# Patient Record
Sex: Female | Born: 1980 | Race: White | Hispanic: No | Marital: Married | Smoking: Never smoker
Health system: Southern US, Community
[De-identification: ages and names within clinical notes are randomized; demographics above are authoritative.]

## PROBLEM LIST (undated history)

## (undated) DIAGNOSIS — Z803 Family history of malignant neoplasm of breast: Secondary | ICD-10-CM

## (undated) DIAGNOSIS — Z9189 Other specified personal risk factors, not elsewhere classified: Secondary | ICD-10-CM

## (undated) DIAGNOSIS — Z9289 Personal history of other medical treatment: Secondary | ICD-10-CM

## (undated) DIAGNOSIS — Z1371 Encounter for nonprocreative screening for genetic disease carrier status: Secondary | ICD-10-CM

## (undated) HISTORY — DX: Personal history of other medical treatment: Z92.89

## (undated) HISTORY — DX: Family history of malignant neoplasm of breast: Z80.3

## (undated) HISTORY — DX: Encounter for nonprocreative screening for genetic disease carrier status: Z13.71

## (undated) HISTORY — DX: Other specified personal risk factors, not elsewhere classified: Z91.89

---

## 2004-04-26 HISTORY — PX: BUNIONECTOMY: SHX129

## 2007-10-12 ENCOUNTER — Encounter: Payer: Self-pay | Admitting: Maternal & Fetal Medicine

## 2007-12-29 ENCOUNTER — Ambulatory Visit: Payer: Self-pay | Admitting: Obstetrics & Gynecology

## 2008-01-02 ENCOUNTER — Inpatient Hospital Stay: Payer: Self-pay | Admitting: Obstetrics and Gynecology

## 2010-07-28 ENCOUNTER — Ambulatory Visit: Payer: Self-pay | Admitting: Obstetrics and Gynecology

## 2011-04-15 ENCOUNTER — Ambulatory Visit: Payer: Self-pay | Admitting: Obstetrics and Gynecology

## 2012-05-07 ENCOUNTER — Emergency Department: Payer: Self-pay | Admitting: Emergency Medicine

## 2012-05-07 ENCOUNTER — Ambulatory Visit: Payer: Self-pay | Admitting: Family Medicine

## 2012-05-07 LAB — URINALYSIS, COMPLETE
Bacteria: NONE SEEN
Ketone: NEGATIVE
Leukocyte Esterase: NEGATIVE
Nitrite: NEGATIVE
Ph: 7 (ref 4.5–8.0)

## 2012-05-07 LAB — COMPREHENSIVE METABOLIC PANEL
Alkaline Phosphatase: 70 U/L (ref 50–136)
Bilirubin,Total: 0.2 mg/dL (ref 0.2–1.0)
Chloride: 104 mmol/L (ref 98–107)
EGFR (African American): >60
Osmolality: 277 (ref 275–301)
Potassium: 3.7 mmol/L (ref 3.5–5.1)
SGPT (ALT): 26 U/L (ref 12–78)
Sodium: 138 mmol/L (ref 136–145)

## 2012-05-07 LAB — CBC
HCT: 40.7 % (ref 35.0–47.0)
HGB: 14.3 g/dL (ref 12.0–16.0)
MCH: 31.5 pg (ref 26.0–34.0)
RBC: 4.53 10*6/uL (ref 3.80–5.20)
WBC: 7.5 10*3/uL (ref 3.6–11.0)

## 2012-11-07 ENCOUNTER — Encounter: Payer: Self-pay | Admitting: General Surgery

## 2012-11-29 ENCOUNTER — Ambulatory Visit: Payer: Self-pay | Admitting: General Surgery

## 2013-09-03 ENCOUNTER — Emergency Department: Payer: Self-pay | Admitting: Emergency Medicine

## 2013-09-03 LAB — BASIC METABOLIC PANEL
ANION GAP: 8 (ref 7–16)
BUN: 15 mg/dL (ref 7–18)
CO2: 24 mmol/L (ref 21–32)
Calcium, Total: 8 mg/dL — ABNORMAL LOW (ref 8.5–10.1)
Chloride: 109 mmol/L — ABNORMAL HIGH (ref 98–107)
Creatinine: 0.8 mg/dL (ref 0.60–1.30)
EGFR (African American): >60
Glucose: 80 mg/dL (ref 65–99)
Osmolality: 281 (ref 275–301)
Potassium: 3.5 mmol/L (ref 3.5–5.1)
SODIUM: 141 mmol/L (ref 136–145)

## 2013-09-03 LAB — CBC
HCT: 37.6 % (ref 35.0–47.0)
HGB: 12.7 g/dL (ref 12.0–16.0)
MCH: 30.3 pg (ref 26.0–34.0)
MCHC: 33.8 g/dL (ref 32.0–36.0)
MCV: 90 fL (ref 80–100)
PLATELETS: 250 10*3/uL (ref 150–440)
RBC: 4.19 10*6/uL (ref 3.80–5.20)
RDW: 13.3 % (ref 11.5–14.5)
WBC: 6.9 10*3/uL (ref 3.6–11.0)

## 2013-09-03 LAB — TROPONIN I: Troponin-I: <0.02 ng/mL

## 2013-11-21 DIAGNOSIS — Z1371 Encounter for nonprocreative screening for genetic disease carrier status: Secondary | ICD-10-CM

## 2013-11-21 HISTORY — DX: Encounter for nonprocreative screening for genetic disease carrier status: Z13.71

## 2014-11-29 ENCOUNTER — Other Ambulatory Visit: Payer: Self-pay | Admitting: Obstetrics and Gynecology

## 2014-11-29 DIAGNOSIS — Z1231 Encounter for screening mammogram for malignant neoplasm of breast: Secondary | ICD-10-CM

## 2014-11-29 DIAGNOSIS — Z9289 Personal history of other medical treatment: Secondary | ICD-10-CM

## 2014-11-29 HISTORY — DX: Personal history of other medical treatment: Z92.89

## 2014-12-09 ENCOUNTER — Other Ambulatory Visit: Payer: Self-pay | Admitting: Obstetrics and Gynecology

## 2014-12-09 ENCOUNTER — Ambulatory Visit
Admission: RE | Admit: 2014-12-09 | Discharge: 2014-12-09 | Disposition: A | Discharge status: Home / Self Care | Payer: BC Managed Care – PPO | Source: Ambulatory Visit | Attending: Obstetrics and Gynecology | Admitting: Obstetrics and Gynecology

## 2014-12-09 DIAGNOSIS — Z1231 Encounter for screening mammogram for malignant neoplasm of breast: Secondary | ICD-10-CM | POA: Diagnosis not present

## 2014-12-09 DIAGNOSIS — R922 Inconclusive mammogram: Secondary | ICD-10-CM | POA: Insufficient documentation

## 2014-12-20 ENCOUNTER — Other Ambulatory Visit: Payer: Self-pay | Admitting: Obstetrics and Gynecology

## 2014-12-20 DIAGNOSIS — R928 Other abnormal and inconclusive findings on diagnostic imaging of breast: Secondary | ICD-10-CM

## 2014-12-20 DIAGNOSIS — N6489 Other specified disorders of breast: Secondary | ICD-10-CM

## 2014-12-27 ENCOUNTER — Ambulatory Visit
Admission: RE | Admit: 2014-12-27 | Discharge: 2014-12-27 | Disposition: A | Discharge status: Home / Self Care | Payer: BC Managed Care – PPO | Source: Ambulatory Visit | Attending: Obstetrics and Gynecology | Admitting: Obstetrics and Gynecology

## 2014-12-27 DIAGNOSIS — N6489 Other specified disorders of breast: Secondary | ICD-10-CM

## 2014-12-27 DIAGNOSIS — R928 Other abnormal and inconclusive findings on diagnostic imaging of breast: Secondary | ICD-10-CM | POA: Insufficient documentation

## 2015-12-19 ENCOUNTER — Other Ambulatory Visit: Payer: Self-pay | Admitting: Obstetrics and Gynecology

## 2015-12-19 DIAGNOSIS — Z1231 Encounter for screening mammogram for malignant neoplasm of breast: Secondary | ICD-10-CM

## 2016-01-08 ENCOUNTER — Other Ambulatory Visit: Payer: Self-pay | Admitting: Obstetrics and Gynecology

## 2016-01-08 ENCOUNTER — Ambulatory Visit
Admission: RE | Admit: 2016-01-08 | Discharge: 2016-01-08 | Disposition: A | Discharge status: Home / Self Care | Payer: BC Managed Care – PPO | Source: Ambulatory Visit | Attending: Obstetrics and Gynecology | Admitting: Obstetrics and Gynecology

## 2016-01-08 DIAGNOSIS — Z1231 Encounter for screening mammogram for malignant neoplasm of breast: Secondary | ICD-10-CM | POA: Diagnosis not present

## 2016-04-22 DIAGNOSIS — Z7185 Encounter for immunization safety counseling: Secondary | ICD-10-CM | POA: Insufficient documentation

## 2016-04-22 DIAGNOSIS — Z7189 Other specified counseling: Secondary | ICD-10-CM | POA: Insufficient documentation

## 2016-11-08 ENCOUNTER — Other Ambulatory Visit: Payer: Self-pay | Admitting: Obstetrics and Gynecology

## 2016-12-14 ENCOUNTER — Encounter: Payer: Self-pay | Admitting: Obstetrics and Gynecology

## 2016-12-14 ENCOUNTER — Ambulatory Visit (INDEPENDENT_AMBULATORY_CARE_PROVIDER_SITE_OTHER): Payer: BC Managed Care – PPO | Admitting: Obstetrics and Gynecology

## 2016-12-14 VITALS — BP 100/70 | HR 72 | Ht 64.0 in | Wt 154.0 lb

## 2016-12-14 DIAGNOSIS — Z3041 Encounter for surveillance of contraceptive pills: Secondary | ICD-10-CM | POA: Diagnosis not present

## 2016-12-14 DIAGNOSIS — Z1231 Encounter for screening mammogram for malignant neoplasm of breast: Secondary | ICD-10-CM | POA: Diagnosis not present

## 2016-12-14 DIAGNOSIS — Z9189 Other specified personal risk factors, not elsewhere classified: Secondary | ICD-10-CM

## 2016-12-14 DIAGNOSIS — Z803 Family history of malignant neoplasm of breast: Secondary | ICD-10-CM

## 2016-12-14 DIAGNOSIS — Z01419 Encounter for gynecological examination (general) (routine) without abnormal findings: Secondary | ICD-10-CM | POA: Diagnosis not present

## 2016-12-14 DIAGNOSIS — Z1239 Encounter for other screening for malignant neoplasm of breast: Secondary | ICD-10-CM

## 2016-12-14 MED ORDER — DESOGEST-ETH ESTRAD TRIPHASIC 0.1/0.125/0.15 -0.025 MG PO TABS: 1.0000 | ORAL_TABLET | Freq: Every day | ORAL | 12 refills | Status: DC

## 2016-12-14 NOTE — Progress Notes (Signed)
Chief Complaint  Patient presents with  . Gynecologic Exam     HPI:      [name] is a 36 y.o. G1P1001 who LMP was Patient's last menstrual period was 12/07/2016., presents today for her annual examination.  Her menses are regular every 28-30 days, lasting 4-6 days.  Dysmenorrhea none. She does not have intermenstrual bleeding.  Sex activity: single partner, contraception - OCP (estrogen/progesterone).  Last Pap: November 29, 2014  Results were: no abnormalities /neg HPV DNA  Hx of STDs: none  Last mammogram: January 09, 2016  Results were: normal--routine follow-up in 12 months There is a FH of breast cancer in her mat aunt and mat grt aunt. Pt is MyRIsk neg 7/15 and her mom is BRCA neg. IBIS=24%. There is no FH of ovarian cancer. The patient does do self-breast exams.  Tobacco use: The patient denies current or previous tobacco use. Alcohol use: none Exercise: moderately active  She does not get adequate calcium and Vitamin D in her diet.    Past Medical History:  Diagnosis Date  . Family history of breast cancer   . History of Papanicolaou smear of cervix 11/29/2014   NIL/Neg  . Increased risk of breast cancer    IBIS 24%  . Testing of female for genetic disease carrier status 11/21/2013   MyRisk Neg    Past Surgical History:  Procedure Laterality Date  . BUNIONECTOMY  2006  . CESAREAN SECTION      Family History  Problem Relation Age of Onset  . Breast cancer Maternal Aunt 30       dx twice 30's and 50's  . Pancreatic cancer Maternal Grandfather 60  . Prostate cancer Maternal Grandfather 34  . Breast cancer Other     Social History   Social History  . Marital status: Unknown    Spouse name: N/A  . Number of children: N/A  . Years of education: N/A   Occupational History  . Not on file.   Social History Main Topics  . Smoking status: Never Smoker  . Smokeless tobacco: Never Used  . Alcohol use Yes     Comment: occasional  . Drug  use: No  . Sexual activity: Yes   Other Topics Concern  . Not on file   Social History Narrative  . No narrative on file     Current Outpatient Prescriptions:  .  albuterol (PROVENTIL HFA;VENTOLIN HFA) 108 (90 Base) MCG/ACT inhaler, Inhale into the lungs., Disp: , Rfl:  .  desogestrel-ethinyl estradiol (VELIVET) 0.1/0.125/0.15 -0.025 MG tablet, Take 1 tablet by mouth daily., Disp: 28 tablet, Rfl: 12  ROS:  Review of Systems  Constitutional: Negative for fatigue, fever and unexpected weight change.  Respiratory: Negative for cough, shortness of breath and wheezing.   Cardiovascular: Negative for chest pain, palpitations and leg swelling.  Gastrointestinal: Negative for blood in stool, constipation, diarrhea, nausea and vomiting.  Endocrine: Negative for cold intolerance, heat intolerance and polyuria.  Genitourinary: Negative for dyspareunia, dysuria, flank pain, frequency, genital sores, hematuria, menstrual problem, pelvic pain, urgency, vaginal bleeding, vaginal discharge and vaginal pain.  Musculoskeletal: Negative for back pain, joint swelling and myalgias.  Skin: Negative for rash.  Neurological: Negative for dizziness, syncope, light-headedness, numbness and headaches.  Hematological: Negative for adenopathy.  Psychiatric/Behavioral: Negative for agitation, confusion, sleep disturbance and suicidal ideas. The patient is not nervous/anxious.      Objective: BP 100/70   Pulse 72   Ht _0  (1.626 m)  Wt 154 lb (69.9 kg)   LMP 12/07/2016   BMI 26.43 kg/m    Physical Exam  Constitutional: She is oriented to person, place, and time. She appears well-developed and well-nourished.  Genitourinary: Vagina normal and uterus normal. There is no rash or tenderness on the right labia. There is no rash or tenderness on the left labia. No erythema or tenderness in the vagina. No vaginal discharge found. Right adnexum does not display mass and does not display tenderness. Left  adnexum does not display mass and does not display tenderness. Cervix does not exhibit motion tenderness or polyp. Uterus is not enlarged or tender.  Neck: Normal range of motion. No thyromegaly present.  Cardiovascular: Normal rate, regular rhythm and normal heart sounds.   No murmur heard. Pulmonary/Chest: Effort normal and breath sounds normal. Right breast exhibits no mass, no nipple discharge, no skin change and no tenderness. Left breast exhibits no mass, no nipple discharge, no skin change and no tenderness.  Abdominal: Soft. There is no tenderness. There is no guarding.  Musculoskeletal: Normal range of motion.  Neurological: She is alert and oriented to person, place, and time. No cranial nerve deficit.  Psychiatric: She has a normal mood and affect. Her behavior is normal.  Vitals reviewed.   Assessment/Plan: Encounter for annual routine gynecological examination  Encounter for surveillance of contraceptive pills - OCP RF. - Plan: desogestrel-ethinyl estradiol (VELIVET) 0.1/0.125/0.15 -0.025 MG tablet  Screening for breast cancer - Pt to sched mammo. - Plan: MM SCREENING BREAST TOMO BILATERAL  Family history of breast cancer - Pt is My Risk neg.  - Plan: MM SCREENING BREAST TOMO BILATERAL  Increased risk of breast cancer - IBSI=24%. Pt to cont yearly CBE, mammos, and monthly SBE. Offered scr breast MRI yearly but pt declines for now. Resume Vit D3 5000 IU dialy.  - Plan: MM SCREENING BREAST TOMO BILATERAL              GYN counsel breast self exam, mammography screening, adequate intake of calcium and vitamin D, diet and exercise     F/U  Return in about 1 year (around 12/14/2017).  Alicia B. Copland, PA-C 12/14/2016 4:37 PM

## 2017-01-11 ENCOUNTER — Ambulatory Visit
Admission: RE | Admit: 2017-01-11 | Discharge: 2017-01-11 | Disposition: A | Discharge status: Home / Self Care | Payer: BC Managed Care – PPO | Source: Ambulatory Visit | Attending: Obstetrics and Gynecology | Admitting: Obstetrics and Gynecology

## 2017-01-11 DIAGNOSIS — Z803 Family history of malignant neoplasm of breast: Secondary | ICD-10-CM

## 2017-01-11 DIAGNOSIS — Z1239 Encounter for other screening for malignant neoplasm of breast: Secondary | ICD-10-CM

## 2017-01-11 DIAGNOSIS — Z9189 Other specified personal risk factors, not elsewhere classified: Secondary | ICD-10-CM

## 2017-01-11 DIAGNOSIS — Z1231 Encounter for screening mammogram for malignant neoplasm of breast: Secondary | ICD-10-CM | POA: Insufficient documentation

## 2017-01-31 ENCOUNTER — Emergency Department
Admission: EM | Admit: 2017-01-31 | Discharge: 2017-01-31 | Disposition: A | Discharge status: Home / Self Care | Payer: BC Managed Care – PPO | Attending: Emergency Medicine | Admitting: Emergency Medicine

## 2017-01-31 ENCOUNTER — Encounter: Payer: Self-pay | Admitting: Emergency Medicine

## 2017-01-31 ENCOUNTER — Emergency Department: Payer: BC Managed Care – PPO

## 2017-01-31 DIAGNOSIS — M5414 Radiculopathy, thoracic region: Secondary | ICD-10-CM

## 2017-01-31 DIAGNOSIS — Z91040 Latex allergy status: Secondary | ICD-10-CM | POA: Diagnosis not present

## 2017-01-31 DIAGNOSIS — R1030 Lower abdominal pain, unspecified: Secondary | ICD-10-CM | POA: Insufficient documentation

## 2017-01-31 DIAGNOSIS — M545 Low back pain: Secondary | ICD-10-CM | POA: Diagnosis present

## 2017-01-31 DIAGNOSIS — Z79899 Other long term (current) drug therapy: Secondary | ICD-10-CM | POA: Diagnosis not present

## 2017-01-31 LAB — CBC WITH DIFFERENTIAL/PLATELET
BASOS ABS: 0 10*3/uL (ref 0–0.1)
Basophils Relative: 0 %
EOS PCT: 0 %
Eosinophils Absolute: 0 10*3/uL (ref 0–0.7)
HEMATOCRIT: 42 % (ref 35.0–47.0)
Hemoglobin: 14.5 g/dL (ref 12.0–16.0)
LYMPHS ABS: 2.1 10*3/uL (ref 1.0–3.6)
LYMPHS PCT: 25 %
MCH: 30.7 pg (ref 26.0–34.0)
MCHC: 34.5 g/dL (ref 32.0–36.0)
MCV: 88.9 fL (ref 80.0–100.0)
MONO ABS: 0.4 10*3/uL (ref 0.2–0.9)
Monocytes Relative: 5 %
Neutro Abs: 6.1 10*3/uL (ref 1.4–6.5)
Neutrophils Relative %: 70 %
Platelets: 273 10*3/uL (ref 150–440)
RBC: 4.72 MIL/uL (ref 3.80–5.20)
RDW: 12.9 % (ref 11.5–14.5)
WBC: 8.7 10*3/uL (ref 3.6–11.0)

## 2017-01-31 LAB — URINALYSIS, COMPLETE (UACMP) WITH MICROSCOPIC
BACTERIA UA: NONE SEEN
BILIRUBIN URINE: NEGATIVE
Glucose, UA: NEGATIVE mg/dL
Hgb urine dipstick: NEGATIVE
KETONES UR: 5 mg/dL — AB
LEUKOCYTES UA: NEGATIVE
Nitrite: NEGATIVE
PROTEIN: NEGATIVE mg/dL
SPECIFIC GRAVITY, URINE: 1.024 (ref 1.005–1.030)
pH: 5 (ref 5.0–8.0)

## 2017-01-31 LAB — COMPREHENSIVE METABOLIC PANEL
ALBUMIN: 3.9 g/dL (ref 3.5–5.0)
ALK PHOS: 38 U/L (ref 38–126)
ALT: 21 U/L (ref 14–54)
ANION GAP: 8 (ref 5–15)
AST: 17 U/L (ref 15–41)
BUN: 21 mg/dL — ABNORMAL HIGH (ref 6–20)
CHLORIDE: 105 mmol/L (ref 101–111)
CO2: 26 mmol/L (ref 22–32)
Calcium: 9 mg/dL (ref 8.9–10.3)
Creatinine, Ser: 0.78 mg/dL (ref 0.44–1.00)
GFR calc Af Amer: >60 mL/min (ref >60)
GFR calc non Af Amer: >60 mL/min (ref >60)
GLUCOSE: 96 mg/dL (ref 65–99)
POTASSIUM: 3.6 mmol/L (ref 3.5–5.1)
SODIUM: 139 mmol/L (ref 135–145)
Total Bilirubin: 0.8 mg/dL (ref 0.3–1.2)
Total Protein: 7.3 g/dL (ref 6.5–8.1)

## 2017-01-31 LAB — POCT PREGNANCY, URINE: Preg Test, Ur: NEGATIVE

## 2017-01-31 MED ORDER — OXYCODONE-ACETAMINOPHEN 5-325 MG PO TABS: 1.0000 | ORAL_TABLET | ORAL | 0 refills | Status: DC | PRN

## 2017-01-31 MED ORDER — KETOROLAC TROMETHAMINE 10 MG PO TABS: 10.0000 mg | ORAL_TABLET | Freq: Three times a day (TID) | ORAL | 0 refills | Status: DC | PRN

## 2017-01-31 MED ORDER — DIPHENHYDRAMINE HCL 50 MG/ML IJ SOLN
25.0000 mg | Freq: Once | INTRAMUSCULAR | Status: AC
  Administered 2017-01-31: 25 mg via INTRAVENOUS

## 2017-01-31 MED ORDER — HYDROMORPHONE HCL 1 MG/ML IJ SOLN
0.5000 mg | Freq: Once | INTRAMUSCULAR | Status: AC
  Administered 2017-01-31: 0.5 mg via INTRAVENOUS
  Filled 2017-01-31: qty 1

## 2017-01-31 MED ORDER — KETOROLAC TROMETHAMINE 30 MG/ML IJ SOLN
30.0000 mg | Freq: Once | INTRAMUSCULAR | Status: AC
  Administered 2017-01-31: 30 mg via INTRAVENOUS
  Filled 2017-01-31: qty 1

## 2017-01-31 MED ORDER — DIPHENHYDRAMINE HCL 50 MG/ML IJ SOLN
INTRAMUSCULAR | Status: AC
  Filled 2017-01-31: qty 1

## 2017-01-31 MED ORDER — SODIUM CHLORIDE 0.9 % IV BOLUS (SEPSIS)
1000.0000 mL | Freq: Once | INTRAVENOUS | Status: AC
  Administered 2017-01-31: 1000 mL via INTRAVENOUS

## 2017-01-31 MED ORDER — IOPAMIDOL (ISOVUE-300) INJECTION 61%
100.0000 mL | Freq: Once | INTRAVENOUS | Status: AC | PRN
  Administered 2017-01-31: 100 mL via INTRAVENOUS

## 2017-01-31 MED ORDER — IOPAMIDOL (ISOVUE-300) INJECTION 61%
30.0000 mL | Freq: Once | INTRAVENOUS | Status: AC | PRN
  Administered 2017-01-31: 30 mL via ORAL

## 2017-01-31 NOTE — ED Notes (Signed)
Dilaudid was diluted and pushed slowly after verifying morphine allergy with Dr. Shaune Pollack. After receiving 1/2 the ordered dose, the patient c/o tightness in her throat and her skin on the throat became reddened. Dr. Shaune Pollack was brought to the bedside and ordered Benadryl. Skin redness was beginning to resolve prior to Pamela Perry being given. Patient still c/o throat tightness after Benadryl was given, voice is clear and patient is able to handle oral secretions well, both before and after Benadryl administration. Skin tone is pink, respirations are unlabored and regular. Husband is at bedside. NS infusing without difficulty.

## 2017-01-31 NOTE — ED Provider Notes (Signed)
[facility] Emergency Department Provider Note ____________________________________________   I have reviewed the triage vital signs and the triage nursing note.  HISTORY  Chief Complaint Back Pain   Historian Patient And husband in the room  HPI Pamela Perry is a 36 y.o. female with a history of low back pain and sciatica over many years although fairly infrequent, presents today with mid thoracic right-sided flank/back pain that wraps around into the abdomen on the right side.  Patient states started about a week ago and she saw her primary care doctor who apparently did an x-ray of her back although don't have access to this, and treat her presumptively for likely sciatica with steroid injection and pertinent in taper, but patient states she did not get better throughout the week and that she is gotten worse despite resting and laying around.  Pain is down to her abdomen as well. She feels like this is not necessarily similar to prior chronic back pain or sciatica episodes.  No fever. No vomiting. Decreased bowel movements, but decreased by mouth intake overall.  Pain currently as moderate to severe. It is constant at baseline but somewhat waxing and waning.  Denies weakness or numbness or incontinence.    Past Medical History:  Diagnosis Date  . Family history of breast cancer   . History of Papanicolaou smear of cervix 11/29/2014   NIL/Neg  . Increased risk of breast cancer    IBIS 24%  . Testing of female for genetic disease carrier status 11/21/2013   MyRisk Neg    Patient Active Problem List   Diagnosis Date Noted  . Increased risk of breast cancer 12/14/2016  . Vaccine counseling 04/22/2016    Past Surgical History:  Procedure Laterality Date  . BUNIONECTOMY  2006  . CESAREAN SECTION      Prior to Admission medications   Medication Sig Start Date End Date Taking? Authorizing Provider  albuterol (PROVENTIL HFA;VENTOLIN HFA)  108 (90 Base) MCG/ACT inhaler Inhale 1-2 puffs into the lungs every 4 (four) hours as needed.  04/12/14   [provider]  desogestrel-ethinyl estradiol (VELIVET) 0.1/0.125/0.15 -0.025 MG tablet Take 1 tablet by mouth daily. 12/14/16   Copland, Ilona Sorrel, PA-C  ketorolac (TORADOL) 10 MG tablet Take 1 tablet (10 mg total) by mouth every 8 (eight) hours as needed for moderate pain. 01/31/17   Governor Rooks, MD  oxyCODONE-acetaminophen (ROXICET) 5-325 MG tablet Take 1 tablet by mouth every 4 (four) hours as needed for severe pain. 01/31/17   Governor Rooks, MD   Recent Kenalog injection to the back Prednisone taper last week  Allergies  Allergen Reactions  . Dilaudid [Hydromorphone Hcl] Shortness Of Breath  . Latex Itching  . Morphine Itching  . Ondansetron Hcl Hives  . Phenergan [Promethazine Hcl] Hives   she's not sure about the morphine allergy.  Family History  Problem Relation Age of Onset  . Breast cancer Maternal Aunt 30       dx twice 30's and 50's  . Pancreatic cancer Maternal Grandfather 60  . Prostate cancer Maternal Grandfather 61  . Breast cancer Other        also leukemia    Social History Social History  Substance Use Topics  . Smoking status: Never Smoker  . Smokeless tobacco: Never Used  . Alcohol use Yes     Comment: occasional    Review of Systems  Constitutional: Negative for fever. Eyes: Negative for visual changes. ENT: Negative for sore throat. Cardiovascular:  Negative for chest pain. Respiratory: Negative for shortness of breath. Gastrointestinal: Negative for vomiting and diarrhea. Genitourinary: Negative for dysuria.  positive for decreased urine output and dark colored urine. Musculoskeletal:  Positive for back pain, Right flank as per history of present illness.. Skin: Negative for rash. Neurological: Negative for headache.  ____________________________________________   PHYSICAL EXAM:  VITAL SIGNS: ED Triage Vitals  Enc Vitals Group      BP 01/31/17 1042 100/60     Pulse Rate 01/31/17 1042 78     Resp 01/31/17 1042 16     Temp 01/31/17 1042 98.3 F (36.8 C)     Temp Source 01/31/17 1042 Oral     SpO2 01/31/17 1042 98 %     Weight 01/31/17 1039 152 lb (68.9 kg)     Height 01/31/17 1039  (1.702 m)     Head Circumference --      Peak Flow --      Pain Score 01/31/17 1038 9     Pain Loc --      Pain Edu? --      Excl. in GC? --      Constitutional: Alert and oriented. Tearful but in no acute distress. HEENT   Head: Normocephalic and atraumatic.      Eyes: Conjunctivae are normal. Pupils equal and round.       Ears:         Nose: No congestion/rhinnorhea.   Mouth/Throat: Mucous membranes are moist.   Neck: No stridor. Cardiovascular/Chest: Normal rate, regular rhythm.  No murmurs, rubs, or gallops. Respiratory: Normal respiratory effort without tachypnea nor retractions. Breath sounds are clear and equal bilaterally. No wheezes/rales/rhonchi. Gastrointestinal: Soft. No distention, no guarding, no rebound. mild tenderness in the right lower quadrant and suprapubic area without focal McBurney's point tenderness. Mild right upper abdominal tenderness.  Genitourinary/rectal:Deferred Musculoskeletal: nontender T and L spine to palpation. Normal appearance of the back. Some mild right-sided flank discomfort on palpation but no obvious swelling or ecchymosis. Nontender with normal range of motion in all extremities. No joint effusions.  No lower extremity tenderness.  No edema. Neurologic:  Normal speech and language. No gross or focal neurologic deficits are appreciated. Skin:  Skin is warm, dry and intact. No rash noted. Psychiatric: Mood and affect are normal. Speech and behavior are normal. Patient exhibits appropriate insight and judgment.   ____________________________________________  LABS (pertinent positives/negatives) I, Governor Rooks, MD the attending physician have reviewed the labs noted  below.  Labs Reviewed  URINALYSIS, COMPLETE (UACMP) WITH MICROSCOPIC - Abnormal; Notable for the following:       Result Value   Color, Urine YELLOW (*)    APPearance HAZY (*)    Ketones, ur 5 (*)    Squamous Epithelial / LPF 6-30 (*)    All other components within normal limits  COMPREHENSIVE METABOLIC PANEL - Abnormal; Notable for the following:    BUN 21 (*)    All other components within normal limits  CBC WITH DIFFERENTIAL/PLATELET  POC URINE PREG, ED  POCT PREGNANCY, URINE    ____________________________________________    EKG I, Governor Rooks, MD, the attending physician have personally viewed and interpreted all ECGs.    None ____________________________________________  RADIOLOGY All Xrays were viewed by me.  Imaging interpreted by Radiologist, and I, Governor Rooks, MD the attending physician have reviewed the radiologist interpretation noted below.  CT abdomen and pelvis with contrast:  IMPRESSION: 1. No evidence of appendicitis or urinary obstruction. 2. Posterior left adnexal  cystic lesion, likely associated with the left ovary and likely benign physiologic cyst. If further evaluation is desired, follow-up pelvic ultrasound in 6-10 weeks could be performed, as clinically indicated. __________________________________________  PROCEDURES  Procedure(s) performed: None  Critical Care performed: None  ____________________________________________  No current facility-administered medications on file prior to encounter.    Current Outpatient Prescriptions on File Prior to Encounter  Medication Sig Dispense Refill  . albuterol (PROVENTIL HFA;VENTOLIN HFA) 108 (90 Base) MCG/ACT inhaler Inhale 1-2 puffs into the lungs every 4 (four) hours as needed.     . desogestrel-ethinyl estradiol (VELIVET) 0.1/0.125/0.15 -0.025 MG tablet Take 1 tablet by mouth daily. 28 tablet 12    ____________________________________________  ED COURSE / ASSESSMENT AND  PLAN  Pertinent labs & imaging results that were available during my care of the patient were reviewed by me and considered in my medical decision making (see chart for details).     patient does have a history of intermittent sciatica, but this last week's episode seems a little bit high and is also involving some abdominal discomfort, so I initiated workup for abdominal pain. She denies any specific weakness or numbness to be concerned about neurologic emergency.  Patient  initially told me she couldn't tolerate morphine, but was listed as itching allergy and her computer record, and patient states she's not sure. Decided to choose an alternative, Dilaudid.   I was called back into the room the patient was having allergic reaction to the Dilaudid. She developed flushed chest and neck and sensation of fullness in the throat. No trouble breathing. She was given Benadryl and did have ease of symptoms over time.  Patient was given Toradol for pain relief although this didn't help that much.  We discussed obtaining CT scan and after discussing recent medical history is proceed.  CT scan reassuring for no acute or emergent findings or anything that should explain her acute pain. There is a possible left sided adnexal cyst, this is not an area where she is hurting at all. Patient discussed internal finding and recommended follow-up for repeat pelvic ultrasound in 6-10 weeks as recommended by radiologist.  We discussed reassuring CT scan. I am going to take her off on the meloxicam since the Toradol seemed to work pretty well and tried this. I was unable to access the [place] stridor database due to changeover of IT servers.     DIFFERENTIAL DIAGNOSIS: Differential diagnosis includes, but is not limited to, ovarian cyst, ovarian torsion, acute appendicitis, diverticulitis, urinary tract infection/pyelonephritis, endometriosis, bowel obstruction, colitis, renal colic, gastroenteritis, hernia, pregnancy  related pain including ectopic pregnancy, etc.    CONSULTATIONS:  None   Patient / Family / Caregiver informed of clinical course, medical decision-making process, and agree with plan.   I discussed return precautions, follow-up instructions, and discharge instructions with patient and/or family.  Discharge Instructions : You were evaluated for right-sided back pain into the abdomen, and  although no certain cause was found, your exam and evaluation are overall reassuring in the emergency department stay. As we discussed, I'm most suspicious at this point of pinched nerve type pain called radiculopathy.  I do agree that the likely next step is evaluation of the spine with an MRI as an outpatient procedure.  While you're taking Toradol, please discontinue other NSAIDs including ibuprofen or meloxicam.  You were prescribed several days of acute pain coverage with oxycodone/acetaminophen. Do not take additional Tylenol with this medication. We discussed that he tolerated this in the past,  although you have had allergy to IV narcotic similar category medications and please watch carefully for any symptoms of allergic reaction.  Return to the emergency department immediately for any worsening or uncontrolled pain, any neurologic symptoms including weakness, numbness, peeing or pooping on yourself, fever, black or bloody stool, or any other symptoms concerning to you.  ___________________________________________   FINAL CLINICAL IMPRESSION(S) / ED DIAGNOSES   Final diagnoses:  Radicular pain of thoracic region  Abdominal pain, lower              Note: This dictation was prepared with Dragon dictation. Any transcriptional errors that result from this process are unintentional    Governor Rooks, MD 01/31/17 1609

## 2017-01-31 NOTE — ED Notes (Signed)
Signature pad not functional. 

## 2017-01-31 NOTE — ED Notes (Signed)
No labored breathing noted, patient can speak in complete sentences easily. Patient denies pain relief. Patient has family at the bedside. No hives or itching reported.

## 2017-01-31 NOTE — ED Triage Notes (Signed)
Arrives with C/O severe low back pain. Patient states she had back pain which she received a steroid injection for on last Monday.  Pain initially resolved, but pain returned yesterday.  Patient denies pain radiating, states pain in across lower back.  Patient is tearful. Skin warm and dry.

## 2017-01-31 NOTE — Discharge Instructions (Signed)
You were evaluated for right-sided back pain into the abdomen, and  although no certain cause was found, your exam and evaluation are overall reassuring in the emergency department stay. As we discussed, I'm most suspicious at this point of pinched nerve type pain called radiculopathy.  I do agree that the likely next step is evaluation of the spine with an MRI as an outpatient procedure.  While you're taking Toradol, please discontinue other NSAIDs including ibuprofen or meloxicam.  You were prescribed several days of acute pain coverage with oxycodone/acetaminophen. Do not take additional Tylenol with this medication. We discussed that he tolerated this in the past, although you have had allergy to IV narcotic similar category medications and please watch carefully for any symptoms of allergic reaction.  Return to the emergency department immediately for any worsening or uncontrolled pain, any neurologic symptoms including weakness, numbness, peeing or pooping on yourself, fever, black or bloody stool, or any other symptoms concerning to you.

## 2017-02-03 ENCOUNTER — Other Ambulatory Visit: Payer: Self-pay | Admitting: Family Medicine

## 2017-02-03 DIAGNOSIS — R109 Unspecified abdominal pain: Secondary | ICD-10-CM

## 2017-02-13 ENCOUNTER — Inpatient Hospital Stay
Admission: RE | Admit: 2017-02-13 | Discharge: 2017-02-13 | Disposition: A | Discharge status: Home / Self Care | Payer: BC Managed Care – PPO | Source: Ambulatory Visit | Attending: Family Medicine | Admitting: Family Medicine

## 2017-02-13 ENCOUNTER — Other Ambulatory Visit: Payer: BC Managed Care – PPO

## 2017-02-24 ENCOUNTER — Other Ambulatory Visit: Payer: Self-pay | Admitting: Physician Assistant

## 2017-02-24 DIAGNOSIS — M545 Low back pain: Secondary | ICD-10-CM

## 2017-02-24 DIAGNOSIS — M5136 Other intervertebral disc degeneration, lumbar region: Secondary | ICD-10-CM

## 2017-03-05 ENCOUNTER — Ambulatory Visit
Admission: RE | Admit: 2017-03-05 | Discharge: 2017-03-05 | Disposition: A | Discharge status: Home / Self Care | Payer: BC Managed Care – PPO | Source: Ambulatory Visit | Attending: Physician Assistant | Admitting: Physician Assistant

## 2017-03-05 DIAGNOSIS — M5136 Other intervertebral disc degeneration, lumbar region: Secondary | ICD-10-CM

## 2017-03-05 DIAGNOSIS — M545 Low back pain: Secondary | ICD-10-CM

## 2017-12-21 ENCOUNTER — Encounter: Payer: Self-pay | Admitting: Obstetrics and Gynecology

## 2017-12-21 ENCOUNTER — Other Ambulatory Visit (HOSPITAL_COMMUNITY)
Admission: RE | Admit: 2017-12-21 | Discharge: 2017-12-21 | Disposition: A | Discharge status: Home / Self Care | Payer: BC Managed Care – PPO | Source: Ambulatory Visit | Attending: Obstetrics and Gynecology | Admitting: Obstetrics and Gynecology

## 2017-12-21 ENCOUNTER — Other Ambulatory Visit: Payer: Self-pay

## 2017-12-21 ENCOUNTER — Ambulatory Visit (INDEPENDENT_AMBULATORY_CARE_PROVIDER_SITE_OTHER): Payer: BC Managed Care – PPO | Admitting: Obstetrics and Gynecology

## 2017-12-21 VITALS — BP 116/68 | HR 100 | Ht 68.0 in | Wt 161.0 lb

## 2017-12-21 DIAGNOSIS — Z3041 Encounter for surveillance of contraceptive pills: Secondary | ICD-10-CM

## 2017-12-21 DIAGNOSIS — Z13220 Encounter for screening for lipoid disorders: Secondary | ICD-10-CM | POA: Insufficient documentation

## 2017-12-21 DIAGNOSIS — Z1231 Encounter for screening mammogram for malignant neoplasm of breast: Secondary | ICD-10-CM

## 2017-12-21 DIAGNOSIS — Z01419 Encounter for gynecological examination (general) (routine) without abnormal findings: Secondary | ICD-10-CM

## 2017-12-21 DIAGNOSIS — Z803 Family history of malignant neoplasm of breast: Secondary | ICD-10-CM

## 2017-12-21 DIAGNOSIS — Z1151 Encounter for screening for human papillomavirus (HPV): Secondary | ICD-10-CM | POA: Insufficient documentation

## 2017-12-21 DIAGNOSIS — Z9189 Other specified personal risk factors, not elsewhere classified: Secondary | ICD-10-CM

## 2017-12-21 DIAGNOSIS — Z124 Encounter for screening for malignant neoplasm of cervix: Secondary | ICD-10-CM

## 2017-12-21 DIAGNOSIS — Z01411 Encounter for gynecological examination (general) (routine) with abnormal findings: Secondary | ICD-10-CM

## 2017-12-21 DIAGNOSIS — Z1239 Encounter for other screening for malignant neoplasm of breast: Secondary | ICD-10-CM

## 2017-12-21 DIAGNOSIS — N9089 Other specified noninflammatory disorders of vulva and perineum: Secondary | ICD-10-CM | POA: Diagnosis not present

## 2017-12-21 DIAGNOSIS — Z0000 Encounter for general adult medical examination without abnormal findings: Secondary | ICD-10-CM | POA: Diagnosis present

## 2017-12-21 MED ORDER — DESOGEST-ETH ESTRAD TRIPHASIC 0.1/0.125/0.15 -0.025 MG PO TABS: 1.0000 | ORAL_TABLET | Freq: Every day | ORAL | 3 refills | Status: DC

## 2017-12-21 NOTE — Progress Notes (Signed)
Chief Complaint  Patient presents with  . Gynecologic Exam    Bartholin cyst?     HPI:      [name] is a 37 y.o. G1P1001 who LMP was Patient's last menstrual period was 12/05/2017., presents today for her annual examination.  Her menses are regular every 28-30 days, lasting 3-4 days.  Dysmenorrhea mild, more LBP. She does not have intermenstrual bleeding.  Sex activity: single partner, contraception - OCP (estrogen/progesterone). Noticed a vaginal nodule LT side for the past month. Has increased/decreased in size, sometimes draining. Did warm compresses. Occas discomfort with touch but no real pain.   Last Pap: November 29, 2014  Results were: no abnormalities /neg HPV DNA  Hx of STDs: none  Last mammogram: 01/11/17  Results were: normal--routine follow-up in 12 months There is a FH of breast cancer in her mat aunt and mat grt aunt. Pt is MyRIsk neg 7/15 and her mom is BRCA neg. IBIS=24%. No screening breast MRI done. There is no FH of ovarian cancer. The patient does do self-breast exams.  Tobacco use: The patient denies current or previous tobacco use. Alcohol use: none Exercise: moderately active  She does get adequate calcium and Vitamin D in her diet. Due for fasting labs.   Past Medical History:  Diagnosis Date  . Family history of breast cancer   . History of Papanicolaou smear of cervix 11/29/2014   NIL/Neg  . Increased risk of breast cancer    IBIS 24%  . Testing of female for genetic disease carrier status 11/21/2013   MyRisk Neg    Past Surgical History:  Procedure Laterality Date  . BUNIONECTOMY  2006  . CESAREAN SECTION      Family History  Problem Relation Age of Onset  . Breast cancer Maternal Aunt 30       dx twice 30's and 50's  . Pancreatic cancer Maternal Grandfather 60  . Prostate cancer Maternal Grandfather 50  . Breast cancer Other        also leukemia    Social History   Socioeconomic History  . Marital status: Married   Spouse name: Not on file  . Number of children: Not on file  . Years of education: Not on file  . Highest education level: Not on file  Occupational History  . Not on file  Social Needs  . Financial resource strain: Not on file  . Food insecurity:    Worry: Not on file    Inability: Not on file  . Transportation needs:    Medical: Not on file    Non-medical: Not on file  Tobacco Use  . Smoking status: Never Smoker  . Smokeless tobacco: Never Used  Substance and Sexual Activity  . Alcohol use: Yes    Comment: occasional  . Drug use: No  . Sexual activity: Yes  Lifestyle  . Physical activity:    Days per week: 0 days    Minutes per session: Not on file  . Stress: Not on file  Relationships  . Social connections:    Talks on phone: Not on file    Gets together: Not on file    Attends religious service: Not on file    Active member of club or organization: Not on file    Attends meetings of clubs or organizations: Not on file    Relationship status: Not on file  . Intimate partner violence:    Fear of current or ex partner: Not on  file    Emotionally abused: Not on file    Physically abused: Not on file    Forced sexual activity: Not on file  Other Topics Concern  . Not on file  Social History Narrative  . Not on file     Current Outpatient Medications:  .  albuterol (PROVENTIL HFA;VENTOLIN HFA) 108 (90 Base) MCG/ACT inhaler, Inhale 1-2 puffs into the lungs every 4 (four) hours as needed. , Disp: , Rfl:  .  desogestrel-ethinyl estradiol (VELIVET) 0.1/0.125/0.15 -0.025 MG tablet, Take 1 tablet by mouth daily., Disp: 84 tablet, Rfl: 3 .  ketorolac (TORADOL) 10 MG tablet, Take 1 tablet (10 mg total) by mouth every 8 (eight) hours as needed for moderate pain., Disp: 15 tablet, Rfl: 0 .  oxyCODONE-acetaminophen (ROXICET) 5-325 MG tablet, Take 1 tablet by mouth every 4 (four) hours as needed for severe pain., Disp: 10 tablet, Rfl: 0  ROS:  Review of Systems    Constitutional: Negative for fatigue, fever and unexpected weight change.  Respiratory: Negative for cough, shortness of breath and wheezing.   Cardiovascular: Negative for chest pain, palpitations and leg swelling.  Gastrointestinal: Negative for blood in stool, constipation, diarrhea, nausea and vomiting.  Endocrine: Negative for cold intolerance, heat intolerance and polyuria.  Genitourinary: Negative for dyspareunia, dysuria, flank pain, frequency, genital sores, hematuria, menstrual problem, pelvic pain, urgency, vaginal bleeding, vaginal discharge and vaginal pain.  Musculoskeletal: Negative for back pain, joint swelling and myalgias.  Skin: Negative for rash.  Neurological: Negative for dizziness, syncope, light-headedness, numbness and headaches.  Hematological: Negative for adenopathy.  Psychiatric/Behavioral: Negative for agitation, confusion, sleep disturbance and suicidal ideas. The patient is not nervous/anxious.     Objective: BP 116/68 (BP Location: Left Arm, Patient Position: Sitting, Cuff Size: Normal)   Pulse 100   Ht _0  (1.727 m)   Wt 161 lb (73 kg)   LMP 12/05/2017   BMI 24.48 kg/m    Physical Exam  Constitutional: She is oriented to person, place, and time. She appears well-developed and well-nourished.  Genitourinary: Vagina normal and uterus normal. There is no rash or tenderness on the right labia.  There is lesion on the left labia. There is no rash or tenderness on the left labia.    No erythema or tenderness in the vagina. No vaginal discharge found. Right adnexum does not display mass and does not display tenderness. Left adnexum does not display mass and does not display tenderness. Cervix does not exhibit motion tenderness or polyp. Uterus is not enlarged or tender.  Genitourinary Comments: ~5 MM RESOLVING LT LABIAL NODULE, NO ERYTHEMA, MILD TENDERNESS, NO D/C  Neck: Normal range of motion. No thyromegaly present.  Cardiovascular: Normal rate,  regular rhythm and normal heart sounds.  No murmur heard. Pulmonary/Chest: Effort normal and breath sounds normal. Right breast exhibits no mass, no nipple discharge, no skin change and no tenderness. Left breast exhibits no mass, no nipple discharge, no skin change and no tenderness.  Abdominal: Soft. There is no tenderness. There is no guarding.  Musculoskeletal: Normal range of motion.  Neurological: She is alert and oriented to person, place, and time. No cranial nerve deficit.  Psychiatric: She has a normal mood and affect. Her behavior is normal.  Vitals reviewed.   Assessment/Plan: Encounter for annual routine gynecological examination  Cervical cancer screening - Plan: Cytology - PAP  Screening for HPV (human papillomavirus) - Plan: Cytology - PAP  Screening for breast cancer - Pt to sched mammo. - Plan: MM  3D SCREEN BREAST BILATERAL  Family history of breast cancer - Plan: MM 3D SCREEN BREAST BILATERAL  Increased risk of breast cancer - MyRIsk neg, IBIS=24%. Cont monthly SBE, yearly CBE and mammos, as well as Vit D. Will call for scr breast MRI if desires.  - Plan: MM 3D SCREEN BREAST BILATERAL  Vulvar lesion - Resolving nodule. Most likely blocked gland. Warm compresses. Reassurance. F/u prn.   Encounter for surveillance of contraceptive pills - OCP RF.  - Plan: desogestrel-ethinyl estradiol (VELIVET) 0.1/0.125/0.15 -0.025 MG tablet  Blood tests for routine general physical examination - Pt pref. No recent labs done. - Plan: Comprehensive metabolic panel, Lipid panel  Screening cholesterol level - Plan: Lipid panel            GYN counsel breast self exam, mammography screening, adequate intake of calcium and vitamin D, diet and exercise     F/U  Return in about 1 year (around 12/22/2018).  Vesta Wheeland B. Nicholos Aloisi, PA-C 12/21/2017 5:04 PM

## 2017-12-21 NOTE — Patient Instructions (Signed)
I value your feedback and entrusting us with your care. If you get a [facility] patient survey, I would appreciate you taking the time to let us know about your experience today. Thank you! 

## 2017-12-27 LAB — CYTOLOGY - PAP
Diagnosis: NEGATIVE
HPV (WINDOPATH): NOT DETECTED

## 2018-01-16 ENCOUNTER — Telehealth: Payer: Self-pay

## 2018-01-16 NOTE — Telephone Encounter (Signed)
Pt wants ABC to call her.  If tomorrow 9/24 call her mom, Eunice BlaseDebbie at [phone].  Pt # [phone]

## 2018-01-17 ENCOUNTER — Encounter: Payer: Self-pay | Admitting: Obstetrics and Gynecology

## 2018-01-17 NOTE — Telephone Encounter (Signed)
Pls look in to this. Thanks.

## 2018-01-17 NOTE — Telephone Encounter (Signed)
Please advise 

## 2018-01-17 NOTE — Telephone Encounter (Signed)
[facility]

## 2018-01-23 NOTE — Telephone Encounter (Signed)
Felicia LM for pt with info. LM to make sure all questions answered/issues resolved.

## 2018-01-27 ENCOUNTER — Ambulatory Visit
Admission: RE | Admit: 2018-01-27 | Discharge: 2018-01-27 | Disposition: A | Discharge status: Home / Self Care | Payer: BC Managed Care – PPO | Source: Ambulatory Visit | Attending: Obstetrics and Gynecology | Admitting: Obstetrics and Gynecology

## 2018-01-27 ENCOUNTER — Other Ambulatory Visit: Payer: BC Managed Care – PPO

## 2018-01-27 DIAGNOSIS — Z0000 Encounter for general adult medical examination without abnormal findings: Secondary | ICD-10-CM

## 2018-01-27 DIAGNOSIS — Z1239 Encounter for other screening for malignant neoplasm of breast: Secondary | ICD-10-CM | POA: Diagnosis not present

## 2018-01-27 DIAGNOSIS — Z9189 Other specified personal risk factors, not elsewhere classified: Secondary | ICD-10-CM | POA: Diagnosis present

## 2018-01-27 DIAGNOSIS — Z803 Family history of malignant neoplasm of breast: Secondary | ICD-10-CM | POA: Diagnosis present

## 2018-01-27 DIAGNOSIS — Z13220 Encounter for screening for lipoid disorders: Secondary | ICD-10-CM

## 2018-01-28 LAB — COMPREHENSIVE METABOLIC PANEL
A/G RATIO: 1.5 (ref 1.2–2.2)
ALBUMIN: 4 g/dL (ref 3.5–5.5)
ALT: 15 IU/L (ref 0–32)
AST: 12 IU/L (ref 0–40)
Alkaline Phosphatase: 43 IU/L (ref 39–117)
BUN / CREAT RATIO: 16 (ref 9–23)
BUN: 10 mg/dL (ref 6–20)
Bilirubin Total: 0.3 mg/dL (ref 0.0–1.2)
CALCIUM: 9.2 mg/dL (ref 8.7–10.2)
CO2: 26 mmol/L (ref 20–29)
CREATININE: 0.64 mg/dL (ref 0.57–1.00)
Chloride: 100 mmol/L (ref 96–106)
GFR calc Af Amer: 131 mL/min/{1.73_m2} (ref >59)
GFR calc non Af Amer: 114 mL/min/{1.73_m2} (ref >59)
GLOBULIN, TOTAL: 2.6 g/dL (ref 1.5–4.5)
Glucose: 77 mg/dL (ref 65–99)
Potassium: 4.2 mmol/L (ref 3.5–5.2)
Sodium: 141 mmol/L (ref 134–144)
Total Protein: 6.6 g/dL (ref 6.0–8.5)

## 2018-01-28 LAB — LIPID PANEL
CHOLESTEROL TOTAL: 167 mg/dL (ref 100–199)
Chol/HDL Ratio: 2.3 ratio (ref 0.0–4.4)
HDL: 73 mg/dL (ref >39)
LDL CALC: 75 mg/dL (ref 0–99)
TRIGLYCERIDES: 96 mg/dL (ref 0–149)
VLDL CHOLESTEROL CAL: 19 mg/dL (ref 5–40)

## 2018-01-29 ENCOUNTER — Encounter: Payer: Self-pay | Admitting: Obstetrics and Gynecology

## 2018-06-12 ENCOUNTER — Telehealth: Payer: Self-pay

## 2018-06-12 NOTE — Telephone Encounter (Signed)
Pt calling triage saying she has questions for ABC. Called her back no answer so I LVMTRC. [phone]

## 2018-06-12 NOTE — Telephone Encounter (Signed)
Pt says since last time she was here, she had a vulvar lesion, 1 little lump, just a few days ago she got 3 new ones. Has tried warm compresses and it is not working. Does she need to come in or what else can you advise.

## 2018-06-13 NOTE — Telephone Encounter (Signed)
Called and left msg to call back and schedule appt.

## 2018-06-13 NOTE — Telephone Encounter (Signed)
Needs appt for me to see them. Thx

## 2018-06-21 ENCOUNTER — Encounter: Payer: Self-pay | Admitting: Obstetrics and Gynecology

## 2018-06-21 ENCOUNTER — Ambulatory Visit (INDEPENDENT_AMBULATORY_CARE_PROVIDER_SITE_OTHER): Payer: BC Managed Care – PPO | Admitting: Obstetrics and Gynecology

## 2018-06-21 VITALS — BP 118/80 | HR 98 | Ht 67.0 in | Wt 162.0 lb

## 2018-06-21 DIAGNOSIS — N9089 Other specified noninflammatory disorders of vulva and perineum: Secondary | ICD-10-CM | POA: Diagnosis not present

## 2018-06-21 NOTE — Progress Notes (Signed)
[name], MD   Chief Complaint  Patient presents with  . Follow-up    new ones and one popped (pus)     HPI:      Pamela Perry is a 38 y.o. G1P1001 who LMP was Patient's last menstrual period was 05/25/2018 (approximate)., presents today for 2 more vulvar lesions over the past month. One was on RT labia minora and was tender, but is now less painful and decreasing in size. A 2nd one was more superior on RT and "popped" with wiping a couple days ago. Was very tender. Pt had similar lesion on the LT side at 8/19 annual. Pt concerned why she is getting them. She does associate wearing a pad with her period as possible trigger. She uses dove sens skin soap, dryer sheets, no wipes. No fevers.   Past Medical History:  Diagnosis Date  . Family history of breast cancer   . History of Papanicolaou smear of cervix 11/29/2014   NIL/Neg  . Increased risk of breast cancer    IBIS 24%  . Testing of female for genetic disease carrier status 11/21/2013   MyRisk Neg    Past Surgical History:  Procedure Laterality Date  . BUNIONECTOMY  2006  . CESAREAN SECTION      Family History  Problem Relation Age of Onset  . Breast cancer Maternal Aunt 30       dx twice 30's and 50's  . Pancreatic cancer Maternal Grandfather 60  . Prostate cancer Maternal Grandfather 25  . Breast cancer Other        also leukemia    Social History   Socioeconomic History  . Marital status: Married    Spouse name: Not on file  . Number of children: Not on file  . Years of education: Not on file  . Highest education level: Not on file  Occupational History  . Not on file  Social Needs  . Financial resource strain: Not on file  . Food insecurity:    Worry: Not on file    Inability: Not on file  . Transportation needs:    Medical: Not on file    Non-medical: Not on file  Tobacco Use  . Smoking status: Never Smoker  . Smokeless tobacco: Never Used  Substance and Sexual Activity  .  Alcohol use: Yes    Comment: occasional  . Drug use: No  . Sexual activity: Not Currently    Birth control/protection: Pill  Lifestyle  . Physical activity:    Days per week: 0 days    Minutes per session: Not on file  . Stress: Not on file  Relationships  . Social connections:    Talks on phone: Not on file    Gets together: Not on file    Attends religious service: Not on file    Active member of club or organization: Not on file    Attends meetings of clubs or organizations: Not on file    Relationship status: Not on file  . Intimate partner violence:    Fear of current or ex partner: Not on file    Emotionally abused: Not on file    Physically abused: Not on file    Forced sexual activity: Not on file  Other Topics Concern  . Not on file  Social History Narrative  . Not on file    Outpatient Medications Prior to Visit  Medication Sig Dispense Refill  . albuterol (PROVENTIL HFA;VENTOLIN HFA) 108 (90  Base) MCG/ACT inhaler Inhale 1-2 puffs into the lungs every 4 (four) hours as needed.     . desogestrel-ethinyl estradiol (VELIVET) 0.1/0.125/0.15 -0.025 MG tablet Take 1 tablet by mouth daily. 84 tablet 3  . ketorolac (TORADOL) 10 MG tablet Take 1 tablet (10 mg total) by mouth every 8 (eight) hours as needed for moderate pain. 15 tablet 0  . oxyCODONE-acetaminophen (ROXICET) 5-325 MG tablet Take 1 tablet by mouth every 4 (four) hours as needed for severe pain. 10 tablet 0   No facility-administered medications prior to visit.       ROS:  Review of Systems  Constitutional: Negative for fever.  Gastrointestinal: Negative for blood in stool, constipation, diarrhea, nausea and vomiting.  Genitourinary: Positive for genital sores. Negative for dyspareunia, dysuria, flank pain, frequency, hematuria, urgency, vaginal bleeding, vaginal discharge and vaginal pain.  Musculoskeletal: Negative for back pain.  Skin: Negative for rash.   BREAST: No symptoms   OBJECTIVE:    Vitals:  BP 118/80   Pulse 98   Ht 5\' 7"  (1.702 m)   Wt 162 lb (73.5 kg)   LMP 05/25/2018 (Approximate)   BMI 25.37 kg/m   Physical Exam Vitals signs reviewed.  Constitutional:      Appearance: She is well-developed.  Neck:     Musculoskeletal: Normal range of motion.  Pulmonary:     Effort: Pulmonary effort is normal.  Genitourinary:    Labia:        Right: Tenderness and lesion present. No rash.        Left: No rash, tenderness or lesion.     Musculoskeletal: Normal range of motion.  Neurological:     Mental Status: She is alert and oriented to person, place, and time.     Cranial Nerves: No cranial nerve deficit.  Psychiatric:        Behavior: Behavior normal.        Thought Content: Thought content normal.        Judgment: Judgment normal.     Assessment/Plan: Vulvar lesion - Resolving. Reassurance. Warm compresses/sitz baths, trim pubic hair to decrease moisture. Line dry underwear. F/u prn    Return if symptoms worsen or fail to improve.  Conchetta Lamia B. Lindley Stachnik, PA-C 06/22/2018 8:38 AM

## 2018-06-22 ENCOUNTER — Encounter: Payer: Self-pay | Admitting: Obstetrics and Gynecology

## 2018-06-22 NOTE — Patient Instructions (Signed)
I value your feedback and entrusting us with your care. If you get a [facility] patient survey, I would appreciate you taking the time to let us know about your experience today. Thank you! 

## 2018-12-03 ENCOUNTER — Other Ambulatory Visit: Payer: Self-pay | Admitting: Obstetrics and Gynecology

## 2018-12-03 DIAGNOSIS — Z3041 Encounter for surveillance of contraceptive pills: Secondary | ICD-10-CM

## 2018-12-05 ENCOUNTER — Other Ambulatory Visit: Payer: Self-pay

## 2018-12-05 DIAGNOSIS — Z3041 Encounter for surveillance of contraceptive pills: Secondary | ICD-10-CM

## 2018-12-05 MED ORDER — VELIVET 0.1/0.125/0.15 -0.025 MG PO TABS: 1.0000 | ORAL_TABLET | Freq: Every day | ORAL | 3 refills | Status: DC

## 2018-12-05 NOTE — Telephone Encounter (Signed)
Pt needing refilll on OCP to last to appt. Sent in to pharm

## 2019-01-17 ENCOUNTER — Ambulatory Visit (INDEPENDENT_AMBULATORY_CARE_PROVIDER_SITE_OTHER): Payer: BC Managed Care – PPO | Admitting: Obstetrics and Gynecology

## 2019-01-17 ENCOUNTER — Other Ambulatory Visit: Payer: Self-pay

## 2019-01-17 ENCOUNTER — Encounter: Payer: Self-pay | Admitting: Obstetrics and Gynecology

## 2019-01-17 VITALS — BP 110/80 | Ht 67.0 in | Wt 159.0 lb

## 2019-01-17 DIAGNOSIS — Z01419 Encounter for gynecological examination (general) (routine) without abnormal findings: Secondary | ICD-10-CM | POA: Diagnosis not present

## 2019-01-17 DIAGNOSIS — Z1239 Encounter for other screening for malignant neoplasm of breast: Secondary | ICD-10-CM

## 2019-01-17 DIAGNOSIS — Z9189 Other specified personal risk factors, not elsewhere classified: Secondary | ICD-10-CM

## 2019-01-17 DIAGNOSIS — Z803 Family history of malignant neoplasm of breast: Secondary | ICD-10-CM

## 2019-01-17 DIAGNOSIS — Z3041 Encounter for surveillance of contraceptive pills: Secondary | ICD-10-CM

## 2019-01-17 MED ORDER — VELIVET 0.1/0.125/0.15 -0.025 MG PO TABS: 1.0000 | ORAL_TABLET | Freq: Every day | ORAL | 3 refills | Status: DC

## 2019-01-17 NOTE — Progress Notes (Signed)
Chief Complaint  Patient presents with  . Gynecologic Exam     HPI:      [name] is a 38 y.o. G1P1001 who LMP was Patient's last menstrual period was 01/01/2019 (approximate)., presents today for her annual examination.  Her menses are regular every 28-30 days, lasting 3-5 days.  Dysmenorrhea mild, more LBP. She does not have intermenstrual bleeding.  Sex activity: single partner, contraception - OCP (estrogen/progesterone). Has had several vaginal lesions (pimple-like) since last yr, sx resolve. Confirmed on exam 2/20. No sx today.   Last Pap: 12/21/17 Results were: no abnormalities /neg HPV DNA  Hx of STDs: none  Last mammogram: 01/27/18  Results were: normal--routine follow-up in 12 months There is a FH of breast cancer in her mat aunt and mat grt aunt. Pt is MyRIsk neg 7/15 and her mom is BRCA neg. IBIS=24%. No screening breast MRI done. There is no FH of ovarian cancer. The patient does do self-breast exams.  Tobacco use: The patient denies current or previous tobacco use. Alcohol use: none  No drug use. Exercise: moderately active  She does get adequate calcium and Vitamin D in her diet. Normal fasting labs last yr.   Past Medical History:  Diagnosis Date  . Family history of breast cancer   . History of Papanicolaou smear of cervix 11/29/2014   NIL/Neg  . Increased risk of breast cancer    IBIS 24%  . Testing of female for genetic disease carrier status 11/21/2013   MyRisk Neg    Past Surgical History:  Procedure Laterality Date  . BUNIONECTOMY  2006  . CESAREAN SECTION      Family History  Problem Relation Age of Onset  . Breast cancer Maternal Aunt 30       dx twice 30's and 50's  . Pancreatic cancer Maternal Grandfather 60  . Prostate cancer Maternal Grandfather 69  . Breast cancer Other        also leukemia    Social History   Socioeconomic History  . Marital status: Married    Spouse name: Not on file  . Number of children: Not on  file  . Years of education: Not on file  . Highest education level: Not on file  Occupational History  . Not on file  Social Needs  . Financial resource strain: Not on file  . Food insecurity    Worry: Not on file    Inability: Not on file  . Transportation needs    Medical: Not on file    Non-medical: Not on file  Tobacco Use  . Smoking status: Never Smoker  . Smokeless tobacco: Never Used  Substance and Sexual Activity  . Alcohol use: Yes    Comment: occasional  . Drug use: No  . Sexual activity: Yes    Birth control/protection: Pill  Lifestyle  . Physical activity    Days per week: 0 days    Minutes per session: Not on file  . Stress: Not on file  Relationships  . Social Herbalist on phone: Not on file    Gets together: Not on file    Attends religious service: Not on file    Active member of club or organization: Not on file    Attends meetings of clubs or organizations: Not on file    Relationship status: Not on file  . Intimate partner violence    Fear of current or ex partner: Not on file  Emotionally abused: Not on file    Physically abused: Not on file    Forced sexual activity: Not on file  Other Topics Concern  . Not on file  Social History Narrative  . Not on file     Current Outpatient Medications:  .  albuterol (PROVENTIL HFA;VENTOLIN HFA) 108 (90 Base) MCG/ACT inhaler, Inhale 1-2 puffs into the lungs every 4 (four) hours as needed. , Disp: , Rfl:  .  desogestrel-ethinyl estradiol (VELIVET) 0.1/0.125/0.15 -0.025 MG tablet, Take 1 tablet by mouth daily., Disp: 84 tablet, Rfl: 3  ROS:  Review of Systems  Constitutional: Negative for fatigue, fever and unexpected weight change.  Respiratory: Negative for cough, shortness of breath and wheezing.   Cardiovascular: Negative for chest pain, palpitations and leg swelling.  Gastrointestinal: Negative for blood in stool, constipation, diarrhea, nausea and vomiting.  Endocrine: Negative for  cold intolerance, heat intolerance and polyuria.  Genitourinary: Negative for dyspareunia, dysuria, flank pain, frequency, genital sores, hematuria, menstrual problem, pelvic pain, urgency, vaginal bleeding, vaginal discharge and vaginal pain.  Musculoskeletal: Negative for back pain, joint swelling and myalgias.  Skin: Negative for rash.  Neurological: Negative for dizziness, syncope, light-headedness, numbness and headaches.  Hematological: Negative for adenopathy.  Psychiatric/Behavioral: Negative for agitation, confusion, sleep disturbance and suicidal ideas. The patient is not nervous/anxious.     Objective: BP 110/80   Ht '5\' 7"'  (1.702 m)   Wt 159 lb (72.1 kg)   LMP 01/01/2019 (Approximate)   BMI 24.90 kg/m    Physical Exam Constitutional:      Appearance: She is well-developed.  Genitourinary:     Vulva, vagina, uterus, right adnexa and left adnexa normal.     No vulval lesion or tenderness noted.     No vaginal discharge, erythema or tenderness.     No cervical motion tenderness or polyp.     Uterus is not enlarged or tender.     No right or left adnexal mass present.     Right adnexa not tender.     Left adnexa not tender.     Genitourinary Comments: ~5 MM RESOLVING LT LABIAL NODULE, NO ERYTHEMA, MILD TENDERNESS, NO D/C  Neck:     Musculoskeletal: Normal range of motion.     Thyroid: No thyromegaly.  Cardiovascular:     Rate and Rhythm: Normal rate and regular rhythm.     Heart sounds: Normal heart sounds. No murmur.  Pulmonary:     Effort: Pulmonary effort is normal.     Breath sounds: Normal breath sounds.  Chest:     Breasts:        Right: No mass, nipple discharge, skin change or tenderness.        Left: No mass, nipple discharge, skin change or tenderness.  Abdominal:     Palpations: Abdomen is soft.     Tenderness: There is no abdominal tenderness. There is no guarding.  Musculoskeletal: Normal range of motion.  Neurological:     General: No focal  deficit present.     Mental Status: She is alert and oriented to person, place, and time.     Cranial Nerves: No cranial nerve deficit.  Skin:    General: Skin is warm and dry.  Psychiatric:        Mood and Affect: Mood normal.        Behavior: Behavior normal.        Thought Content: Thought content normal.        Judgment: Judgment normal.  Vitals signs reviewed.     Assessment/Plan: Encounter for annual routine gynecological examination  Screening for breast cancer - Plan: MM 3D SCREEN BREAST BILATERAL--pt to sched mammo  Family history of breast cancer - Plan: MM 3D SCREEN BREAST BILATERAL  Increased risk of breast cancer - Plan: MM 3D SCREEN BREAST BILATERAL; Cont monthly SBE, yearly CBE and mammos, and Vit D supp. Discussed scr breast MRI--pt to f/u by 2/21 if desires.   Encounter for surveillance of contraceptive pills - OCP RF.  - Plan: desogestrel-ethinyl estradiol (VELIVET) 0.1/0.125/0.15 -0.025 MG tablet            Meds ordered this encounter  Medications  . desogestrel-ethinyl estradiol (VELIVET) 0.1/0.125/0.15 -0.025 MG tablet    Sig: Take 1 tablet by mouth daily.    Dispense:  84 tablet    Refill:  3    Order Specific Question:   Supervising Provider    Answer:   Gae Dry [[id number]]    GYN counsel breast self exam, mammography screening, adequate intake of calcium and vitamin D, diet and exercise     F/U  Return in about 1 year (around 01/17/2020).  Yohan Samons B. Santos Sollenberger, PA-C 01/17/2019 4:32 PM

## 2019-01-17 NOTE — Patient Instructions (Addendum)
I value your feedback and entrusting us with your care. If you get a [facility] patient survey, I would appreciate you taking the time to let us know about your experience today. Thank you!  [facility]: [phone]    

## 2019-01-29 ENCOUNTER — Encounter: Payer: Self-pay | Admitting: Obstetrics and Gynecology

## 2019-01-29 ENCOUNTER — Ambulatory Visit
Admission: RE | Admit: 2019-01-29 | Discharge: 2019-01-29 | Disposition: A | Discharge status: Home / Self Care | Payer: BC Managed Care – PPO | Source: Ambulatory Visit | Attending: Obstetrics and Gynecology | Admitting: Obstetrics and Gynecology

## 2019-01-29 ENCOUNTER — Other Ambulatory Visit: Payer: Self-pay

## 2019-01-29 DIAGNOSIS — Z1239 Encounter for other screening for malignant neoplasm of breast: Secondary | ICD-10-CM | POA: Diagnosis present

## 2019-01-29 DIAGNOSIS — Z1231 Encounter for screening mammogram for malignant neoplasm of breast: Secondary | ICD-10-CM | POA: Insufficient documentation

## 2019-01-29 DIAGNOSIS — Z803 Family history of malignant neoplasm of breast: Secondary | ICD-10-CM | POA: Diagnosis present

## 2019-01-29 DIAGNOSIS — Z9189 Other specified personal risk factors, not elsewhere classified: Secondary | ICD-10-CM | POA: Insufficient documentation

## 2019-02-15 ENCOUNTER — Other Ambulatory Visit: Payer: Self-pay | Admitting: Family Medicine

## 2019-02-15 DIAGNOSIS — J0100 Acute maxillary sinusitis, unspecified: Secondary | ICD-10-CM

## 2019-05-06 ENCOUNTER — Encounter: Payer: Self-pay | Admitting: Obstetrics and Gynecology

## 2019-06-23 ENCOUNTER — Ambulatory Visit: Payer: BC Managed Care – PPO

## 2019-10-09 ENCOUNTER — Telehealth: Payer: Self-pay

## 2019-10-09 NOTE — Telephone Encounter (Signed)
Patient has a few questions about the vaccine. Cb#[phone]

## 2019-10-09 NOTE — Telephone Encounter (Signed)
Per Cleda Daub. Patient calling returning missed call from Anastashia Westerfeld please advise

## 2019-10-09 NOTE — Telephone Encounter (Signed)
I wouldn't do J&J. Moderna or Pfizer is best. Pls notify pt. Thx.

## 2019-10-09 NOTE — Telephone Encounter (Signed)
Spoke w/patient. She's inquiring about the Covid vaccine and trying to determine which brand to receive. Her parents received the [place] and [place] one and did fine, but she heard about the recall d/t blood clots and knows that is an issue with birth control and wanted to get [facility] opinion on the different vaccine brands.

## 2019-10-09 NOTE — Telephone Encounter (Signed)
LMTRC. Advised to ask for Kal Chait or leave specific question on vm and we will return call with response. Also advised office will be closed this pm for training.

## 2019-10-09 NOTE — Telephone Encounter (Signed)
Called pt, no answer, LVMTRC. 

## 2019-10-10 NOTE — Telephone Encounter (Signed)
Pt aware.

## 2020-01-21 ENCOUNTER — Other Ambulatory Visit: Payer: Self-pay

## 2020-01-21 ENCOUNTER — Ambulatory Visit (INDEPENDENT_AMBULATORY_CARE_PROVIDER_SITE_OTHER): Payer: BC Managed Care – PPO | Admitting: Obstetrics and Gynecology

## 2020-01-21 ENCOUNTER — Encounter: Payer: Self-pay | Admitting: Obstetrics and Gynecology

## 2020-01-21 VITALS — BP 100/60 | Ht 66.0 in | Wt 159.0 lb

## 2020-01-21 DIAGNOSIS — Z01419 Encounter for gynecological examination (general) (routine) without abnormal findings: Secondary | ICD-10-CM | POA: Diagnosis not present

## 2020-01-21 DIAGNOSIS — Z1231 Encounter for screening mammogram for malignant neoplasm of breast: Secondary | ICD-10-CM

## 2020-01-21 DIAGNOSIS — Z3041 Encounter for surveillance of contraceptive pills: Secondary | ICD-10-CM

## 2020-01-21 DIAGNOSIS — Z9189 Other specified personal risk factors, not elsewhere classified: Secondary | ICD-10-CM

## 2020-01-21 DIAGNOSIS — Z803 Family history of malignant neoplasm of breast: Secondary | ICD-10-CM

## 2020-01-21 MED ORDER — VELIVET 0.1/0.125/0.15 -0.025 MG PO TABS: 1.0000 | ORAL_TABLET | Freq: Every day | ORAL | 3 refills | Status: DC

## 2020-01-21 NOTE — Patient Instructions (Addendum)
I value your feedback and entrusting us with your care. If you get a [facility] patient survey, I would appreciate you taking the time to let us know about your experience today. Thank you! ° °As of April 05, 2019, your lab results will be released to your MyChart immediately, before I even have a chance to see them. Please give me time to review them and contact you if there are any abnormalities. Thank you for your patience.  ° °[facility]: [phone] ° ° ° °

## 2020-01-21 NOTE — Progress Notes (Signed)
Chief Complaint  Patient presents with  . Gynecologic Exam     HPI:      [name] is a 39 y.o. G1P1001 who LMP was Patient's last menstrual period was 01/02/2020 (approximate)., presents today for her annual examination.  Her menses are regular every 28-30 days, lasting 3-5 days.  Dysmenorrhea mild, more LBP. She does not have intermenstrual bleeding.  Sex activity: single partner, contraception - OCP (estrogen/progesterone). Last Pap: 12/21/17 Results were: no abnormalities /neg HPV DNA  Hx of STDs: none  Last mammogram: 01/29/19  Results were: normal--routine follow-up in 12 months There is a FH of breast cancer in her mat aunt and mat grt aunt.  Pt is MyRIsk neg 7/15 and her mom is BRCA neg. IBIS=24%. No screening breast MRI done. There is no FH of ovarian cancer. The patient does do self-breast exams. Taking Vit D supp.  Tobacco use: The patient denies current or previous tobacco use. Alcohol use: none  No drug use. Exercise: very active  She does get adequate calcium and Vitamin D in her diet. Normal fasting labs 2019   Past Medical History:  Diagnosis Date  . Family history of breast cancer   . History of Papanicolaou smear of cervix 11/29/2014   NIL/Neg  . Increased risk of breast cancer    IBIS 24%  . Testing of female for genetic disease carrier status 11/21/2013   MyRisk Neg    Past Surgical History:  Procedure Laterality Date  . BUNIONECTOMY  2006  . CESAREAN SECTION      Family History  Problem Relation Age of Onset  . Breast cancer Maternal Aunt 30       dx twice 30's and 50's  . Prostate cancer Maternal Grandfather 52  . Breast cancer Other        also leukemia    Social History   Socioeconomic History  . Marital status: Married    Spouse name: Not on file  . Number of children: Not on file  . Years of education: Not on file  . Highest education level: Not on file  Occupational History  . Not on file  Tobacco Use  . Smoking  status: Never Smoker  . Smokeless tobacco: Never Used  Vaping Use  . Vaping Use: Never used  Substance and Sexual Activity  . Alcohol use: Yes    Comment: occasional  . Drug use: No  . Sexual activity: Yes    Birth control/protection: Pill  Other Topics Concern  . Not on file  Social History Narrative  . Not on file   Social Determinants of Health   Financial Resource Strain:   . Difficulty of Paying Living Expenses: Not on file  Food Insecurity:   . Worried About Charity fundraiser in the Last Year: Not on file  . Ran Out of Food in the Last Year: Not on file  Transportation Needs:   . Lack of Transportation (Medical): Not on file  . Lack of Transportation (Non-Medical): Not on file  Physical Activity:   . Days of Exercise per Week: Not on file  . Minutes of Exercise per Session: Not on file  Stress:   . Feeling of Stress : Not on file  Social Connections:   . Frequency of Communication with Friends and Family: Not on file  . Frequency of Social Gatherings with Friends and Family: Not on file  . Attends Religious Services: Not on file  . Active Member of Clubs  or Organizations: Not on file  . Attends Archivist Meetings: Not on file  . Marital Status: Not on file  Intimate Partner Violence:   . Fear of Current or Ex-Partner: Not on file  . Emotionally Abused: Not on file  . Physically Abused: Not on file  . Sexually Abused: Not on file     Current Outpatient Medications:  .  albuterol (PROVENTIL HFA;VENTOLIN HFA) 108 (90 Base) MCG/ACT inhaler, Inhale 1-2 puffs into the lungs every 4 (four) hours as needed. , Disp: , Rfl:  .  desogestrel-ethinyl estradiol (VELIVET) 0.1/0.125/0.15 -0.025 MG tablet, Take 1 tablet by mouth daily., Disp: 84 tablet, Rfl: 3 .  Multiple Vitamin (MULTI-VITAMIN) tablet, Take 1 tablet by mouth daily., Disp: , Rfl:   ROS:  Review of Systems  Constitutional: Negative for fatigue, fever and unexpected weight change.  Respiratory:  Negative for cough, shortness of breath and wheezing.   Cardiovascular: Negative for chest pain, palpitations and leg swelling.  Gastrointestinal: Negative for blood in stool, constipation, diarrhea, nausea and vomiting.  Endocrine: Negative for cold intolerance, heat intolerance and polyuria.  Genitourinary: Negative for dyspareunia, dysuria, flank pain, frequency, genital sores, hematuria, menstrual problem, pelvic pain, urgency, vaginal bleeding, vaginal discharge and vaginal pain.  Musculoskeletal: Negative for back pain, joint swelling and myalgias.  Skin: Negative for rash.  Neurological: Negative for dizziness, syncope, light-headedness, numbness and headaches.  Hematological: Negative for adenopathy.  Psychiatric/Behavioral: Negative for agitation, confusion, sleep disturbance and suicidal ideas. The patient is not nervous/anxious.     Objective: BP 100/60   Ht '5\' 6"'  (1.676 m)   Wt 159 lb (72.1 kg)   LMP 01/02/2020 (Approximate)   BMI 25.66 kg/m    Physical Exam Constitutional:      Appearance: She is well-developed.  Genitourinary:     Vulva, vagina, cervix, uterus, right adnexa and left adnexa normal.     No vulval lesion or tenderness noted.     No vaginal discharge, erythema or tenderness.     No cervical polyp.     Uterus is not enlarged or tender.     No right or left adnexal mass present.     Right adnexa not tender.     Left adnexa not tender.  Neck:     Thyroid: No thyromegaly.  Cardiovascular:     Rate and Rhythm: Normal rate and regular rhythm.     Heart sounds: Normal heart sounds. No murmur heard.   Pulmonary:     Effort: Pulmonary effort is normal.     Breath sounds: Normal breath sounds.  Chest:     Breasts:        Right: No mass, nipple discharge, skin change or tenderness.        Left: No mass, nipple discharge, skin change or tenderness.  Abdominal:     Palpations: Abdomen is soft.     Tenderness: There is no abdominal tenderness. There is no  guarding.  Musculoskeletal:        General: Normal range of motion.     Cervical back: Normal range of motion.  Neurological:     General: No focal deficit present.     Mental Status: She is alert and oriented to person, place, and time.     Cranial Nerves: No cranial nerve deficit.  Skin:    General: Skin is warm and dry.  Psychiatric:        Mood and Affect: Mood normal.        Behavior:  Behavior normal.        Thought Content: Thought content normal.        Judgment: Judgment normal.  Vitals reviewed.     Assessment/Plan: Encounter for annual routine gynecological examination  Encounter for surveillance of contraceptive pills - OCP RF.  - Plan: desogestrel-ethinyl estradiol (VELIVET) 0.1/0.125/0.15 -0.025 MG tablet; OCP RF.  Encounter for screening mammogram for malignant neoplasm of breast - Plan: MM 3D SCREEN BREAST BILATERAL; pt to sched mammo  Family history of breast cancer - Plan: MM 3D SCREEN BREAST BILATERAL  Increased risk of breast cancer - Plan: MM 3D SCREEN BREAST BILATERAL; pt aware of monthly SBE, yearly CBE and mammos as well as scr breast MRI. Will call for ref if desires. Cont Vit D supp   Meds ordered this encounter  Medications  . desogestrel-ethinyl estradiol (VELIVET) 0.1/0.125/0.15 -0.025 MG tablet    Sig: Take 1 tablet by mouth daily.    Dispense:  84 tablet    Refill:  3    Order Specific Question:   Supervising Provider    Answer:   Gae Dry [[id number]]    GYN counsel breast self exam, mammography screening, adequate intake of calcium and vitamin D, diet and exercise     F/U  Return in about 1 year (around 01/20/2021).  Maurisa Tesmer B. Glanda Spanbauer, PA-C 01/21/2020 4:29 PM

## 2020-02-14 ENCOUNTER — Ambulatory Visit
Admission: RE | Admit: 2020-02-14 | Discharge: 2020-02-14 | Disposition: A | Discharge status: Home / Self Care | Payer: 59 | Source: Ambulatory Visit | Attending: Obstetrics and Gynecology | Admitting: Obstetrics and Gynecology

## 2020-02-14 ENCOUNTER — Other Ambulatory Visit: Payer: Self-pay

## 2020-02-14 DIAGNOSIS — Z9189 Other specified personal risk factors, not elsewhere classified: Secondary | ICD-10-CM | POA: Diagnosis present

## 2020-02-14 DIAGNOSIS — Z1231 Encounter for screening mammogram for malignant neoplasm of breast: Secondary | ICD-10-CM | POA: Diagnosis not present

## 2020-02-14 DIAGNOSIS — Z803 Family history of malignant neoplasm of breast: Secondary | ICD-10-CM | POA: Insufficient documentation

## 2020-11-12 ENCOUNTER — Encounter: Payer: Self-pay | Admitting: Obstetrics and Gynecology

## 2020-11-12 ENCOUNTER — Other Ambulatory Visit: Payer: Self-pay

## 2020-11-12 ENCOUNTER — Ambulatory Visit (INDEPENDENT_AMBULATORY_CARE_PROVIDER_SITE_OTHER): Payer: 59 | Admitting: Obstetrics and Gynecology

## 2020-11-12 VITALS — BP 110/80 | Ht 66.0 in | Wt 167.0 lb

## 2020-11-12 DIAGNOSIS — Z9189 Other specified personal risk factors, not elsewhere classified: Secondary | ICD-10-CM

## 2020-11-12 DIAGNOSIS — Z1231 Encounter for screening mammogram for malignant neoplasm of breast: Secondary | ICD-10-CM

## 2020-11-12 DIAGNOSIS — Z3041 Encounter for surveillance of contraceptive pills: Secondary | ICD-10-CM | POA: Diagnosis not present

## 2020-11-12 DIAGNOSIS — Z01419 Encounter for gynecological examination (general) (routine) without abnormal findings: Secondary | ICD-10-CM | POA: Diagnosis not present

## 2020-11-12 DIAGNOSIS — L989 Disorder of the skin and subcutaneous tissue, unspecified: Secondary | ICD-10-CM

## 2020-11-12 DIAGNOSIS — Z803 Family history of malignant neoplasm of breast: Secondary | ICD-10-CM

## 2020-11-12 MED ORDER — VELIVET 0.1/0.125/0.15 -0.025 MG PO TABS: 1.0000 | ORAL_TABLET | Freq: Every day | ORAL | 3 refills | Status: DC

## 2020-11-12 NOTE — Progress Notes (Signed)
Chief Complaint  Patient presents with   Gynecologic Exam    2nd opinion on a spot on neck     HPI:      [name] is a 40 y.o. G1P1001 who LMP was Patient's last menstrual period was 11/05/2020 (exact date)., presents today for her annual examination.  Her menses are regular every 28-30 days, lasting 3-4 days.  Dysmenorrhea mild, more LBP. She does not have intermenstrual bleeding.  Sex activity: single partner, contraception - OCP (estrogen/progesterone). Last Pap: 12/21/17 Results were: no abnormalities /neg HPV DNA  Hx of STDs: none  Last mammogram: 02/14/20  Results were: normal--routine follow-up in 12 months There is a FH of breast cancer in her mat aunt and mat grt aunt.  Pt is MyRIsk neg 7/15 and her mom is BRCA neg. IBIS=24%. No screening breast MRI done. There is no FH of ovarian cancer. The patient does do self-breast exams. Taking Vit D supp.  Tobacco use: The patient denies current or previous tobacco use. Alcohol use: none  No drug use. Exercise: very active  She does get adequate calcium and Vitamin D in her diet. Normal fasting labs 2019  Has had lesion on RT side of neck for a couple yrs. Unsure if cyst vs lymph node per providers' opinion in past. NT, no change in size.   Past Medical History:  Diagnosis Date   Family history of breast cancer    History of Papanicolaou smear of cervix 11/29/2014   NIL/Neg   Increased risk of breast cancer    IBIS 24%   Testing of female for genetic disease carrier status 11/21/2013   MyRisk Neg    Past Surgical History:  Procedure Laterality Date   BUNIONECTOMY  2006   CESAREAN SECTION      Family History  Problem Relation Age of Onset   Breast cancer Maternal Aunt 30       dx twice 30's and 50's   Prostate cancer Maternal Grandfather 79   Breast cancer Other        also leukemia    Social History   Socioeconomic History   Marital status: Married    Spouse name: Not on file   Number of  children: Not on file   Years of education: Not on file   Highest education level: Not on file  Occupational History   Not on file  Tobacco Use   Smoking status: Never   Smokeless tobacco: Never  Vaping Use   Vaping Use: Never used  Substance and Sexual Activity   Alcohol use: Yes    Comment: occasional   Drug use: No   Sexual activity: Yes    Birth control/protection: Pill  Other Topics Concern   Not on file  Social History Narrative   Not on file   Social Determinants of Health   Financial Resource Strain: Not on file  Food Insecurity: Not on file  Transportation Needs: Not on file  Physical Activity: Not on file  Stress: Not on file  Social Connections: Not on file  Intimate Partner Violence: Not on file     Current Outpatient Medications:    albuterol (PROVENTIL HFA;VENTOLIN HFA) 108 (90 Base) MCG/ACT inhaler, Inhale 1-2 puffs into the lungs every 4 (four) hours as needed. , Disp: , Rfl:    Multiple Vitamin (MULTI-VITAMIN) tablet, Take 1 tablet by mouth daily., Disp: , Rfl:    desogestrel-ethinyl estradiol (VELIVET) 0.1/0.125/0.15 -0.025 MG tablet, Take 1 tablet by mouth daily.,  Disp: 84 tablet, Rfl: 3  ROS:  Review of Systems  Constitutional:  Negative for fatigue, fever and unexpected weight change.  Respiratory:  Negative for cough, shortness of breath and wheezing.   Cardiovascular:  Negative for chest pain, palpitations and leg swelling.  Gastrointestinal:  Negative for blood in stool, constipation, diarrhea, nausea and vomiting.  Endocrine: Negative for cold intolerance, heat intolerance and polyuria.  Genitourinary:  Negative for dyspareunia, dysuria, flank pain, frequency, genital sores, hematuria, menstrual problem, pelvic pain, urgency, vaginal bleeding, vaginal discharge and vaginal pain.  Musculoskeletal:  Negative for back pain, joint swelling and myalgias.  Skin:  Negative for rash.  Neurological:  Negative for dizziness, syncope, light-headedness,  numbness and headaches.  Hematological:  Negative for adenopathy.  Psychiatric/Behavioral:  Negative for agitation, confusion, sleep disturbance and suicidal ideas. The patient is not nervous/anxious.    Objective: BP 110/80   Ht '5\' 6"'  (1.676 m)   Wt 167 lb (75.8 kg)   LMP 11/05/2020 (Exact Date)   BMI 26.95 kg/m    Physical Exam Constitutional:      Appearance: She is well-developed.  Genitourinary:     Vulva normal.     Right Labia: No rash, tenderness or lesions.    Left Labia: No tenderness, lesions or rash.    No vaginal discharge, erythema or tenderness.      Right Adnexa: not tender and no mass present.    Left Adnexa: not tender and no mass present.    No cervical friability or polyp.     Uterus is not enlarged or tender.  Breasts:    Right: No mass, nipple discharge, skin change or tenderness.     Left: No mass, nipple discharge, skin change or tenderness.  Neck:     Thyroid: No thyromegaly.   Cardiovascular:     Rate and Rhythm: Normal rate and regular rhythm.     Heart sounds: Normal heart sounds. No murmur heard. Pulmonary:     Effort: Pulmonary effort is normal.     Breath sounds: Normal breath sounds.  Abdominal:     Palpations: Abdomen is soft.     Tenderness: There is no abdominal tenderness. There is no guarding or rebound.  Musculoskeletal:        General: Normal range of motion.     Cervical back: Normal range of motion.  Lymphadenopathy:     Cervical: No cervical adenopathy.  Neurological:     General: No focal deficit present.     Mental Status: She is alert and oriented to person, place, and time.     Cranial Nerves: No cranial nerve deficit.  Skin:    General: Skin is warm and dry.  Psychiatric:        Mood and Affect: Mood normal.        Behavior: Behavior normal.        Thought Content: Thought content normal.        Judgment: Judgment normal.  Vitals reviewed.    Assessment/Plan:  Encounter for annual routine gynecological  examination  Encounter for surveillance of contraceptive pills - Plan: desogestrel-ethinyl estradiol (VELIVET) 0.1/0.125/0.15 -0.025 MG tablet; OCP RF  Encounter for screening mammogram for malignant neoplasm of breast - Plan: MM 3D SCREEN BREAST BILATERAL; pt to sched mammo  Family history of breast cancer - Plan: MM 3D SCREEN BREAST BILATERAL  Increased risk of breast cancer - Plan: MM 3D SCREEN BREAST BILATERAL; pt aware of monthly SBE, yearly CBE and mammos as well as scr  breast MRI. Will call for ref if desires. Cont Vit D supp  Lesion of neck--benign lesion; question cyst vs superficial lymph node. Reassurance. F/u if changes in size.   Meds ordered this encounter  Medications   desogestrel-ethinyl estradiol (VELIVET) 0.1/0.125/0.15 -0.025 MG tablet    Sig: Take 1 tablet by mouth daily.    Dispense:  84 tablet    Refill:  3    Order Specific Question:   Supervising Provider    Answer:   Gae Dry [[id number]]     GYN counsel breast self exam, mammography screening, adequate intake of calcium and vitamin D, diet and exercise     F/U  Return in about 1 year (around 11/12/2021).  Donavon Kimrey B. Esra Frankowski, PA-C 11/12/2020 1:55 PM

## 2020-11-12 NOTE — Patient Instructions (Signed)
I value your feedback and you entrusting us with your care. If you get a [facility] patient survey, I would appreciate you taking the time to let us know about your experience today. Thank you!  [facility]: [phone]      

## 2021-02-06 ENCOUNTER — Encounter: Payer: Self-pay | Admitting: Obstetrics and Gynecology

## 2021-02-10 ENCOUNTER — Encounter: Payer: Self-pay | Admitting: Obstetrics and Gynecology

## 2021-02-10 ENCOUNTER — Other Ambulatory Visit: Payer: Self-pay

## 2021-02-10 ENCOUNTER — Ambulatory Visit (INDEPENDENT_AMBULATORY_CARE_PROVIDER_SITE_OTHER): Payer: 59 | Admitting: Obstetrics and Gynecology

## 2021-02-10 VITALS — BP 120/76 | Ht 67.0 in | Wt 174.0 lb

## 2021-02-10 DIAGNOSIS — L290 Pruritus ani: Secondary | ICD-10-CM | POA: Diagnosis not present

## 2021-02-10 DIAGNOSIS — N898 Other specified noninflammatory disorders of vagina: Secondary | ICD-10-CM | POA: Diagnosis not present

## 2021-02-10 LAB — POCT WET PREP WITH KOH
Clue Cells Wet Prep HPF POC: NEGATIVE
KOH Prep POC: NEGATIVE
Trichomonas, UA: NEGATIVE
Yeast Wet Prep HPF POC: NEGATIVE

## 2021-02-10 MED ORDER — CLOTRIMAZOLE-BETAMETHASONE 1-0.05 % EX CREA: TOPICAL_CREAM | CUTANEOUS | 0 refills | Status: DC

## 2021-02-10 NOTE — Progress Notes (Signed)
[name], MD   Chief Complaint  Patient presents with   Vaginal Exam    Noticed about two weeks ago bump/pimple looking, uncomfortable, itchy    HPI:      [name] is a 40 y.o. G1P1001 whose LMP was Patient's last menstrual period was 01/26/2021 (exact date)., presents today for perianal itching/irritation for about 2 wks. Started with a pimple that popped after a few days. Area still burns and itches. Has tried benadryl, hydrocortisone crm, zyrtec, neosporin and epsom salt baths without relief. Pt also with increased vag d/c with mild itching in perineal area. No new soaps/detergents. Has had some cloudy urine without other UTI sx.    Past Medical History:  Diagnosis Date   Family history of breast cancer    History of Papanicolaou smear of cervix 11/29/2014   NIL/Neg   Increased risk of breast cancer    IBIS 24%   Testing of female for genetic disease carrier status 11/21/2013   MyRisk Neg    Past Surgical History:  Procedure Laterality Date   BUNIONECTOMY  2006   CESAREAN SECTION      Family History  Problem Relation Age of Onset   Breast cancer Maternal Aunt 30       dx twice 30's and 50's   Prostate cancer Maternal Grandfather 52   Breast cancer Other        also leukemia    Social History   Socioeconomic History   Marital status: Married    Spouse name: Not on file   Number of children: Not on file   Years of education: Not on file   Highest education level: Not on file  Occupational History   Not on file  Tobacco Use   Smoking status: Never   Smokeless tobacco: Never  Vaping Use   Vaping Use: Never used  Substance and Sexual Activity   Alcohol use: Yes    Comment: occasional   Drug use: No   Sexual activity: Not Currently    Birth control/protection: Pill  Other Topics Concern   Not on file  Social History Narrative   Not on file   Social Determinants of Health   Financial Resource Strain: Not on file  Food  Insecurity: Not on file  Transportation Needs: Not on file  Physical Activity: Not on file  Stress: Not on file  Social Connections: Not on file  Intimate Partner Violence: Not on file    Outpatient Medications Prior to Visit  Medication Sig Dispense Refill   albuterol (PROVENTIL HFA;VENTOLIN HFA) 108 (90 Base) MCG/ACT inhaler Inhale 1-2 puffs into the lungs every 4 (four) hours as needed.      desogestrel-ethinyl estradiol (VELIVET) 0.1/0.125/0.15 -0.025 MG tablet Take 1 tablet by mouth daily. 84 tablet 3   Multiple Vitamin (MULTI-VITAMIN) tablet Take 1 tablet by mouth daily.     No facility-administered medications prior to visit.      ROS:  Review of Systems  Constitutional:  Negative for fever.  Gastrointestinal:  Negative for blood in stool, constipation, diarrhea, nausea and vomiting.  Genitourinary:  Positive for vaginal discharge. Negative for dyspareunia, dysuria, flank pain, frequency, hematuria, urgency, vaginal bleeding and vaginal pain.  Musculoskeletal:  Negative for back pain.  Skin:  Negative for rash.  BREAST: No symptoms   OBJECTIVE:   Vitals:  BP 120/76   Ht 5\' 7"  (1.702 m)   Wt 174 lb (78.9 kg)   LMP 01/26/2021 (Exact Date)  BMI 27.25 kg/m   Physical Exam Vitals reviewed.  Constitutional:      Appearance: She is well-developed.  Pulmonary:     Effort: Pulmonary effort is normal.  Genitourinary:    General: Normal vulva.     Pubic Area: No rash.      Labia:        Right: No rash, tenderness or lesion.        Left: No rash, tenderness or lesion.      Vagina: Vaginal discharge present. No erythema or tenderness.     Cervix: Normal.     Uterus: Normal. Not enlarged and not tender.      Adnexa: Right adnexa normal and left adnexa normal.       Right: No mass or tenderness.         Left: No mass or tenderness.       Rectum: External hemorrhoid present. No anal fissure.    Musculoskeletal:        General: Normal range of motion.      Cervical back: Normal range of motion.  Skin:    General: Skin is warm and dry.  Neurological:     General: No focal deficit present.     Mental Status: She is alert and oriented to person, place, and time.  Psychiatric:        Mood and Affect: Mood normal.        Behavior: Behavior normal.        Thought Content: Thought content normal.        Judgment: Judgment normal.    Results: Results for orders placed or performed in visit on 02/10/21 (from the past 24 hour(s))  POCT Wet Prep with KOH     Status: Normal   Collection Time: 02/10/21  3:00 PM  Result Value Ref Range   Trichomonas, UA Negative    Clue Cells Wet Prep HPF POC neg    Epithelial Wet Prep HPF POC     Yeast Wet Prep HPF POC neg    Bacteria Wet Prep HPF POC     RBC Wet Prep HPF POC     WBC Wet Prep HPF POC     KOH Prep POC Negative Negative     Assessment/Plan: Perianal itch - Plan: clotrimazole-betamethasone (LOTRISONE) cream; looks like fungal derm on exam. Neg wet prep. Rx lotrisone crm/cool compresses. Reassurance. F/u prn  Vaginal discharge - Plan: POCT Wet Prep with KOH; neg wet prep, neg exam. Can use lotrisone crm ext prn itch. F/u prn.    Meds ordered this encounter  Medications   clotrimazole-betamethasone (LOTRISONE) cream    Sig: Apply externally BID prn sx up to 2 wks    Dispense:  15 g    Refill:  0    Order Specific Question:   Supervising Provider    Answer:   Nadara Mustard [[id number]]      Return if symptoms worsen or fail to improve.  Anayeli Arel B. Mackay Hanauer, PA-C 02/10/2021 3:01 PM

## 2021-02-17 ENCOUNTER — Other Ambulatory Visit: Payer: Self-pay

## 2021-02-17 ENCOUNTER — Ambulatory Visit
Admission: RE | Admit: 2021-02-17 | Discharge: 2021-02-17 | Disposition: A | Discharge status: Home / Self Care | Payer: 59 | Source: Ambulatory Visit | Attending: Obstetrics and Gynecology | Admitting: Obstetrics and Gynecology

## 2021-02-17 DIAGNOSIS — Z1231 Encounter for screening mammogram for malignant neoplasm of breast: Secondary | ICD-10-CM | POA: Insufficient documentation

## 2021-02-17 DIAGNOSIS — Z9189 Other specified personal risk factors, not elsewhere classified: Secondary | ICD-10-CM | POA: Insufficient documentation

## 2021-02-17 DIAGNOSIS — Z803 Family history of malignant neoplasm of breast: Secondary | ICD-10-CM | POA: Insufficient documentation

## 2021-11-09 DIAGNOSIS — Z0000 Encounter for general adult medical examination without abnormal findings: Secondary | ICD-10-CM | POA: Diagnosis not present

## 2021-12-04 ENCOUNTER — Other Ambulatory Visit: Payer: Self-pay | Admitting: Obstetrics and Gynecology

## 2021-12-04 DIAGNOSIS — Z3041 Encounter for surveillance of contraceptive pills: Secondary | ICD-10-CM

## 2022-02-05 ENCOUNTER — Other Ambulatory Visit: Payer: Self-pay | Admitting: Obstetrics and Gynecology

## 2022-02-05 DIAGNOSIS — Z1231 Encounter for screening mammogram for malignant neoplasm of breast: Secondary | ICD-10-CM

## 2022-02-10 NOTE — Progress Notes (Unsigned)
No chief complaint on file.    HPI:      [name] is a 40 y.o. G1P1001 who LMP was No LMP recorded., presents today for her annual examination.  Her menses are regular every 28-30 days, lasting 3-4 days.  Dysmenorrhea mild, more LBP. She does not have intermenstrual bleeding.  Sex activity: single partner, contraception - OCP (estrogen/progesterone). Last Pap: 12/21/17 Results were: no abnormalities /neg HPV DNA  Hx of STDs: none  Last mammogram: 02/17/21  Results were: normal--routine follow-up in 12 months; has appt 10/23 There is a FH of breast cancer in her mat aunt and mat grt aunt.  Pt is MyRIsk neg 7/15 and her mom is BRCA neg. IBIS=24%. No screening breast MRI done. There is no FH of ovarian cancer. The patient does do self-breast exams. Taking Vit D supp.  Tobacco use: The patient denies current or previous tobacco use. Alcohol use: none  No drug use. Exercise: very active  She does get adequate calcium and Vitamin D in her diet. Labs with PCP 7/23  Has had lesion on RT side of neck for a couple yrs. Unsure if cyst vs lymph node per providers' opinion in past. NT, no change in size.   reCTAL ITCH???  Past Medical History:  Diagnosis Date   Family history of breast cancer    History of Papanicolaou smear of cervix 11/29/2014   NIL/Neg   Increased risk of breast cancer    IBIS 24%   Testing of female for genetic disease carrier status 11/21/2013   MyRisk Neg    Past Surgical History:  Procedure Laterality Date   BUNIONECTOMY  2006   CESAREAN SECTION      Family History  Problem Relation Age of Onset   Breast cancer Maternal Aunt 30       dx twice 30's and 50's   Prostate cancer Maternal Grandfather 79   Breast cancer Other        also leukemia    Social History   Socioeconomic History   Marital status: Married    Spouse name: Not on file   Number of children: Not on file   Years of education: Not on file   Highest education level: Not  on file  Occupational History   Not on file  Tobacco Use   Smoking status: Never   Smokeless tobacco: Never  Vaping Use   Vaping Use: Never used  Substance and Sexual Activity   Alcohol use: Yes    Comment: occasional   Drug use: No   Sexual activity: Not Currently    Birth control/protection: Pill  Other Topics Concern   Not on file  Social History Narrative   Not on file   Social Determinants of Health   Financial Resource Strain: Not on file  Food Insecurity: Not on file  Transportation Needs: Not on file  Physical Activity: Unknown (12/21/2017)   Exercise Vital Sign    Days of Exercise per Week: 0 days    Minutes of Exercise per Session: Not on file  Stress: Not on file  Social Connections: Not on file  Intimate Partner Violence: Not on file     Current Outpatient Medications:    albuterol (PROVENTIL HFA;VENTOLIN HFA) 108 (90 Base) MCG/ACT inhaler, Inhale 1-2 puffs into the lungs every 4 (four) hours as needed. , Disp: , Rfl:    clotrimazole-betamethasone (LOTRISONE) cream, Apply externally BID prn sx up to 2 wks, Disp: 15 g, Rfl: 0  Multiple Vitamin (MULTI-VITAMIN) tablet, Take 1 tablet by mouth daily., Disp: , Rfl:    VELIVET 0.1/0.125/0.15 -0.025 MG tablet, TAKE 1 TABLET BY MOUTH EVERY DAY, Disp: 84 tablet, Rfl: 0  ROS:  Review of Systems  Constitutional:  Negative for fatigue, fever and unexpected weight change.  Respiratory:  Negative for cough, shortness of breath and wheezing.   Cardiovascular:  Negative for chest pain, palpitations and leg swelling.  Gastrointestinal:  Negative for blood in stool, constipation, diarrhea, nausea and vomiting.  Endocrine: Negative for cold intolerance, heat intolerance and polyuria.  Genitourinary:  Negative for dyspareunia, dysuria, flank pain, frequency, genital sores, hematuria, menstrual problem, pelvic pain, urgency, vaginal bleeding, vaginal discharge and vaginal pain.  Musculoskeletal:  Negative for back pain, joint  swelling and myalgias.  Skin:  Negative for rash.  Neurological:  Negative for dizziness, syncope, light-headedness, numbness and headaches.  Hematological:  Negative for adenopathy.  Psychiatric/Behavioral:  Negative for agitation, confusion, sleep disturbance and suicidal ideas. The patient is not nervous/anxious.     Objective: There were no vitals taken for this visit.   Physical Exam Constitutional:      Appearance: She is well-developed.  Genitourinary:     Vulva normal.     Right Labia: No rash, tenderness or lesions.    Left Labia: No tenderness, lesions or rash.    No vaginal discharge, erythema or tenderness.      Right Adnexa: not tender and no mass present.    Left Adnexa: not tender and no mass present.    No cervical friability or polyp.     Uterus is not enlarged or tender.  Breasts:    Right: No mass, nipple discharge, skin change or tenderness.     Left: No mass, nipple discharge, skin change or tenderness.  Neck:     Thyroid: No thyromegaly.   Cardiovascular:     Rate and Rhythm: Normal rate and regular rhythm.     Heart sounds: Normal heart sounds. No murmur heard. Pulmonary:     Effort: Pulmonary effort is normal.     Breath sounds: Normal breath sounds.  Abdominal:     Palpations: Abdomen is soft.     Tenderness: There is no abdominal tenderness. There is no guarding or rebound.  Musculoskeletal:        General: Normal range of motion.     Cervical back: Normal range of motion.  Lymphadenopathy:     Cervical: No cervical adenopathy.  Neurological:     General: No focal deficit present.     Mental Status: She is alert and oriented to person, place, and time.     Cranial Nerves: No cranial nerve deficit.  Skin:    General: Skin is warm and dry.  Psychiatric:        Mood and Affect: Mood normal.        Behavior: Behavior normal.        Thought Content: Thought content normal.        Judgment: Judgment normal.  Vitals reviewed.      Assessment/Plan:  Encounter for annual routine gynecological examination  Encounter for surveillance of contraceptive pills - Plan: desogestrel-ethinyl estradiol (VELIVET) 0.1/0.125/0.15 -0.025 MG tablet; OCP RF  Encounter for screening mammogram for malignant neoplasm of breast - Plan: MM 3D SCREEN BREAST BILATERAL; pt to sched mammo  Family history of breast cancer - Plan: MM 3D SCREEN BREAST BILATERAL  Increased risk of breast cancer - Plan: MM 3D SCREEN BREAST BILATERAL; pt aware of  monthly SBE, yearly CBE and mammos as well as scr breast MRI. Will call for ref if desires. Cont Vit D supp  Lesion of neck--benign lesion; question cyst vs superficial lymph node. Reassurance. F/u if changes in size.   No orders of the defined types were placed in this encounter.    GYN counsel breast self exam, mammography screening, adequate intake of calcium and vitamin D, diet and exercise     F/U  No follow-ups on file.  Lydiann Bonifas B. Caress Reffitt, PA-C 02/10/2022 4:01 PM

## 2022-02-11 ENCOUNTER — Encounter: Payer: Self-pay | Admitting: Obstetrics and Gynecology

## 2022-02-11 ENCOUNTER — Ambulatory Visit (INDEPENDENT_AMBULATORY_CARE_PROVIDER_SITE_OTHER): Payer: 59 | Admitting: Obstetrics and Gynecology

## 2022-02-11 ENCOUNTER — Other Ambulatory Visit (HOSPITAL_COMMUNITY)
Admission: RE | Admit: 2022-02-11 | Discharge: 2022-02-11 | Disposition: A | Discharge status: Home / Self Care | Payer: 59 | Source: Ambulatory Visit | Attending: Obstetrics and Gynecology | Admitting: Obstetrics and Gynecology

## 2022-02-11 VITALS — BP 100/60 | Ht 66.0 in | Wt 171.0 lb

## 2022-02-11 DIAGNOSIS — Z1151 Encounter for screening for human papillomavirus (HPV): Secondary | ICD-10-CM | POA: Insufficient documentation

## 2022-02-11 DIAGNOSIS — Z01419 Encounter for gynecological examination (general) (routine) without abnormal findings: Secondary | ICD-10-CM | POA: Diagnosis not present

## 2022-02-11 DIAGNOSIS — Z124 Encounter for screening for malignant neoplasm of cervix: Secondary | ICD-10-CM

## 2022-02-11 DIAGNOSIS — Z9189 Other specified personal risk factors, not elsewhere classified: Secondary | ICD-10-CM

## 2022-02-11 DIAGNOSIS — Z1231 Encounter for screening mammogram for malignant neoplasm of breast: Secondary | ICD-10-CM

## 2022-02-11 DIAGNOSIS — Z3041 Encounter for surveillance of contraceptive pills: Secondary | ICD-10-CM | POA: Diagnosis not present

## 2022-02-11 DIAGNOSIS — Z803 Family history of malignant neoplasm of breast: Secondary | ICD-10-CM | POA: Diagnosis not present

## 2022-02-11 MED ORDER — VELIVET 0.1/0.125/0.15 -0.025 MG PO TABS: 1.0000 | ORAL_TABLET | Freq: Every day | ORAL | 3 refills | Status: DC

## 2022-02-11 NOTE — Patient Instructions (Signed)
I value your feedback and you entrusting us with your care. If you get a [facility] patient survey, I would appreciate you taking the time to let us know about your experience today. Thank you! ? ? ?

## 2022-02-15 LAB — CYTOLOGY - PAP
Adequacy: ABSENT
Comment: NEGATIVE
Diagnosis: NEGATIVE
High risk HPV: NEGATIVE

## 2022-02-23 ENCOUNTER — Ambulatory Visit
Admission: RE | Admit: 2022-02-23 | Discharge: 2022-02-23 | Disposition: A | Discharge status: Home / Self Care | Payer: 59 | Source: Ambulatory Visit | Attending: Obstetrics and Gynecology | Admitting: Obstetrics and Gynecology

## 2022-02-23 DIAGNOSIS — Z1231 Encounter for screening mammogram for malignant neoplasm of breast: Secondary | ICD-10-CM | POA: Diagnosis not present

## 2022-07-22 IMAGING — MG DIGITAL SCREENING BILAT W/ TOMO W/ CAD
8 series · 8 of 24 positions shown · non-contrast
Comparison: Previous exam(s).

CLINICAL DATA: Screening.

EXAM:
DIGITAL SCREENING BILATERAL MAMMOGRAM WITH TOMO AND CAD

[L MLO synth-2D]
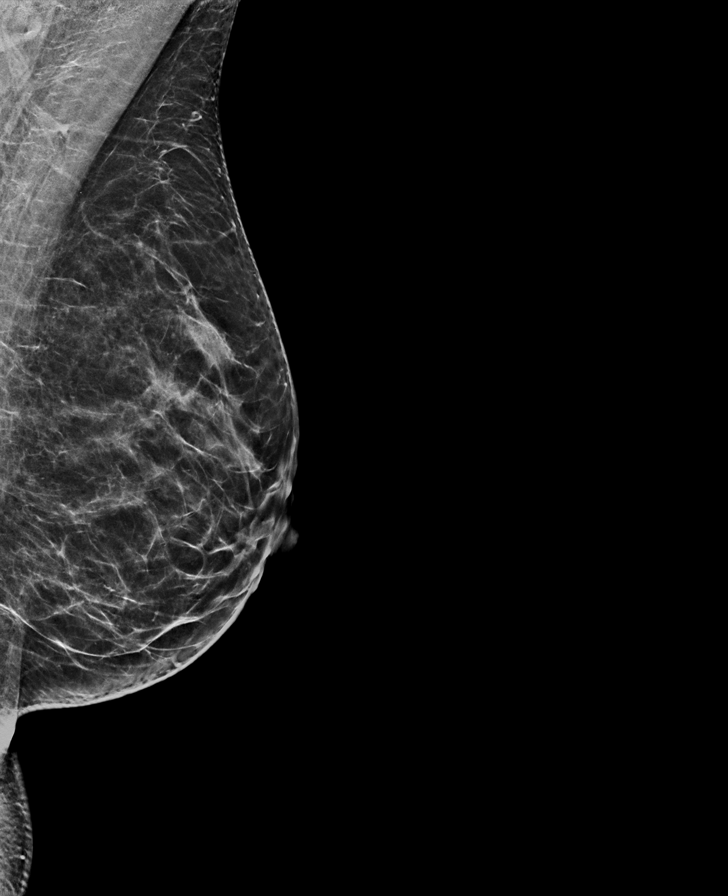

[R CC synth-2D]
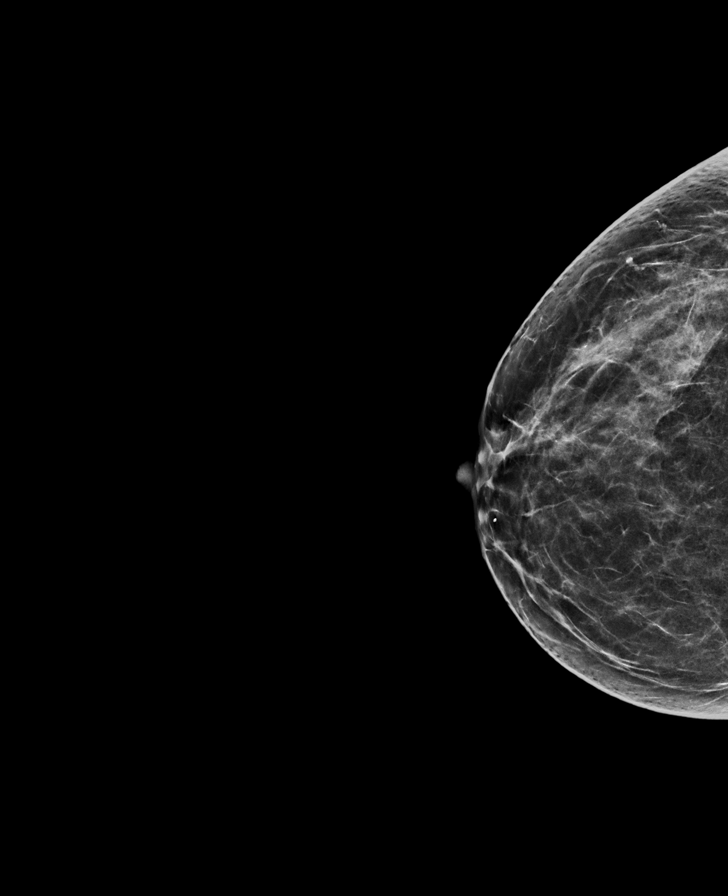

[L CC synth-2D]
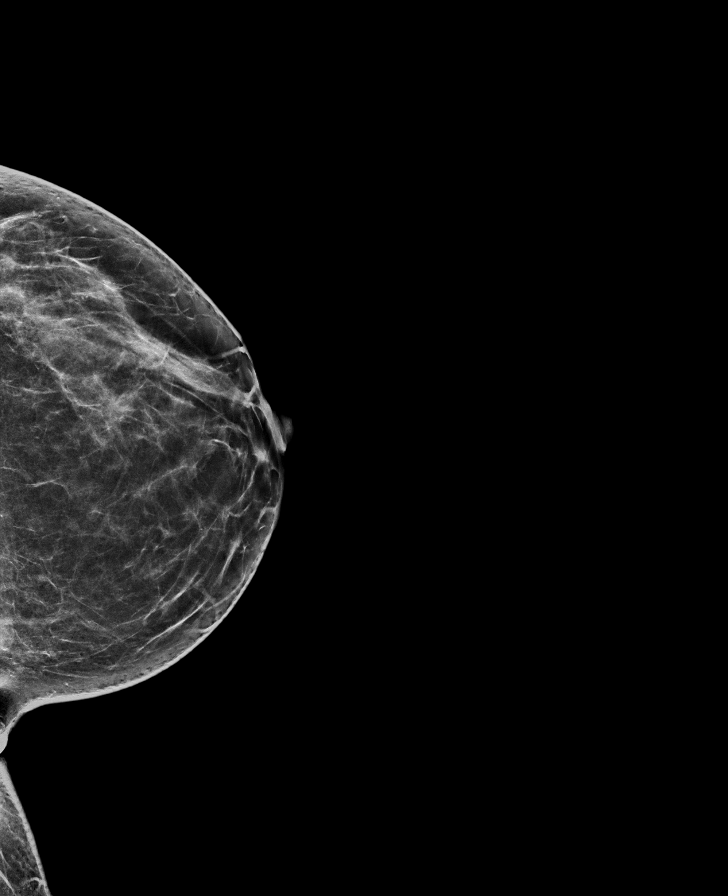

[R MLO synth-2D]
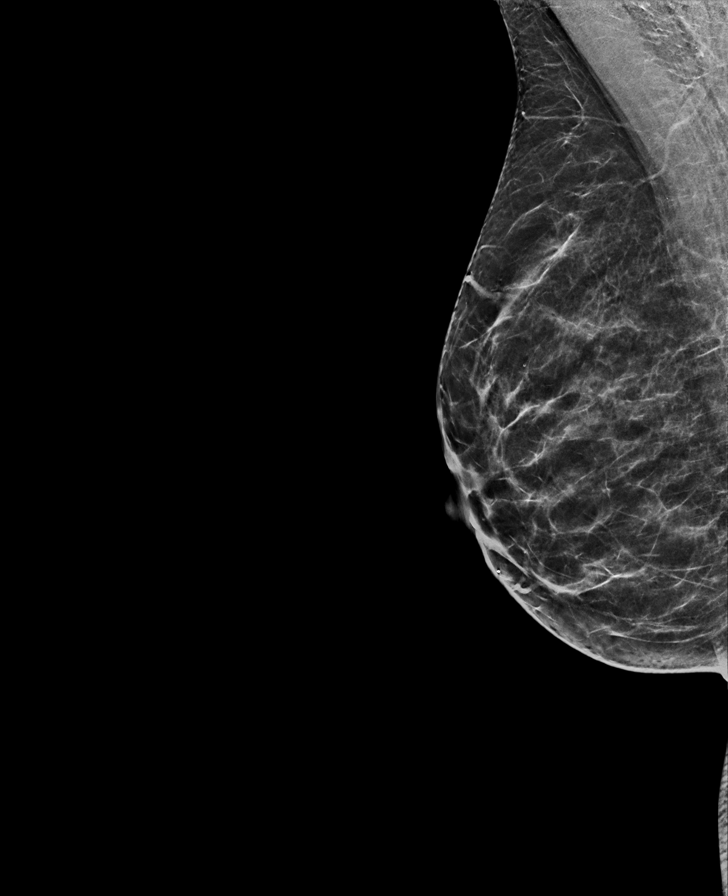

[R CC tomo · tomo slice 32/63.0]
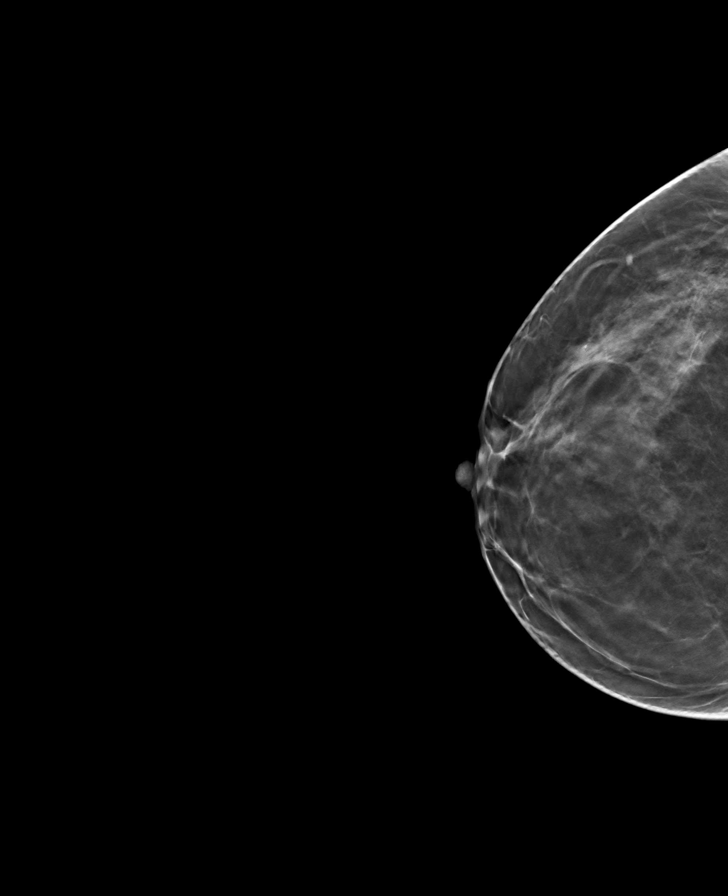

[R MLO tomo · tomo slice 31/61.0]
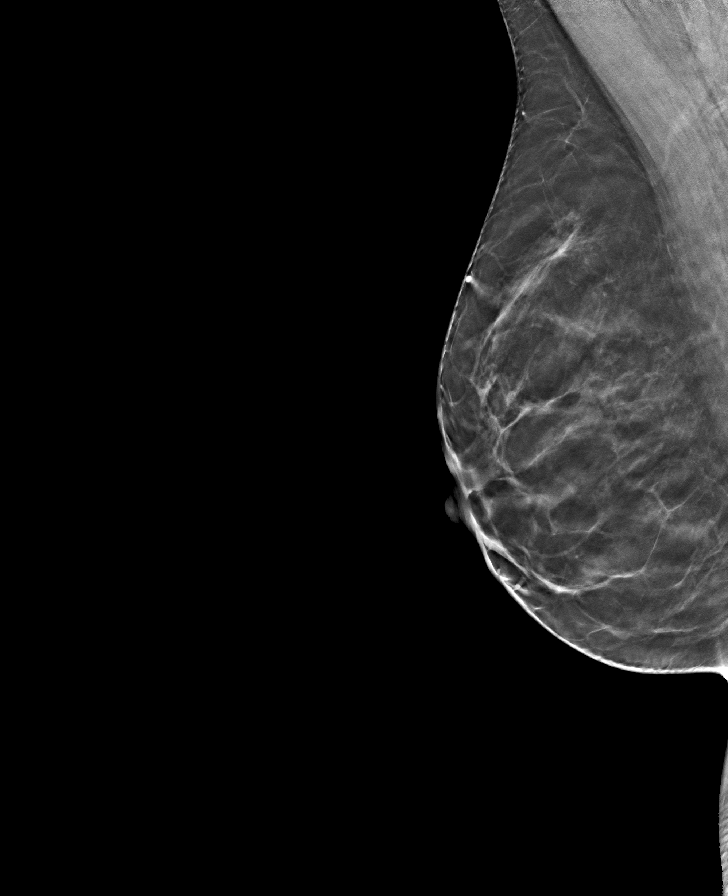

[L MLO tomo · tomo slice 32/63.0]
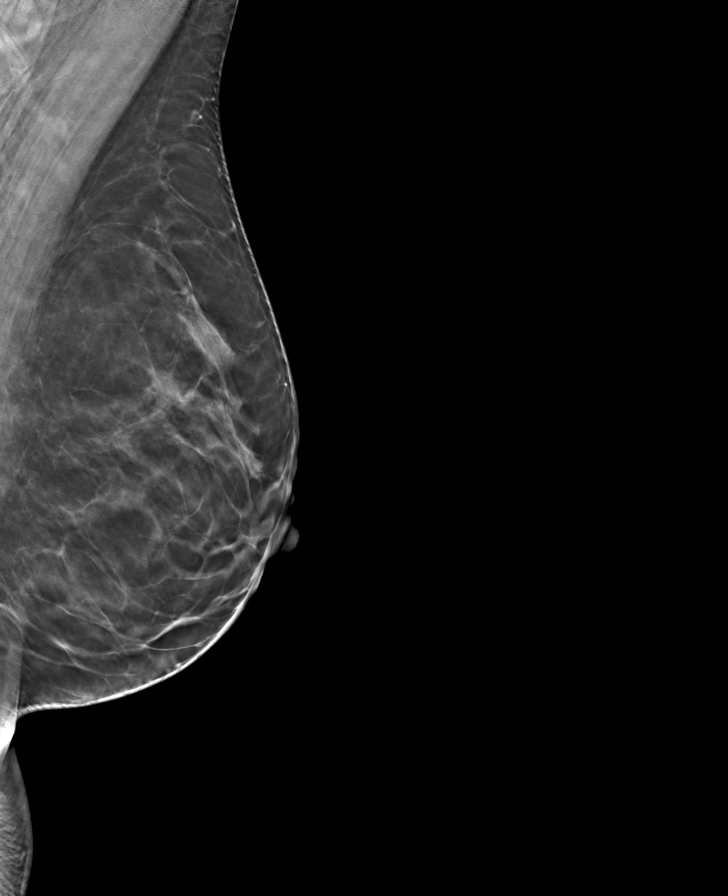

[L CC tomo · tomo slice 33/65.0]
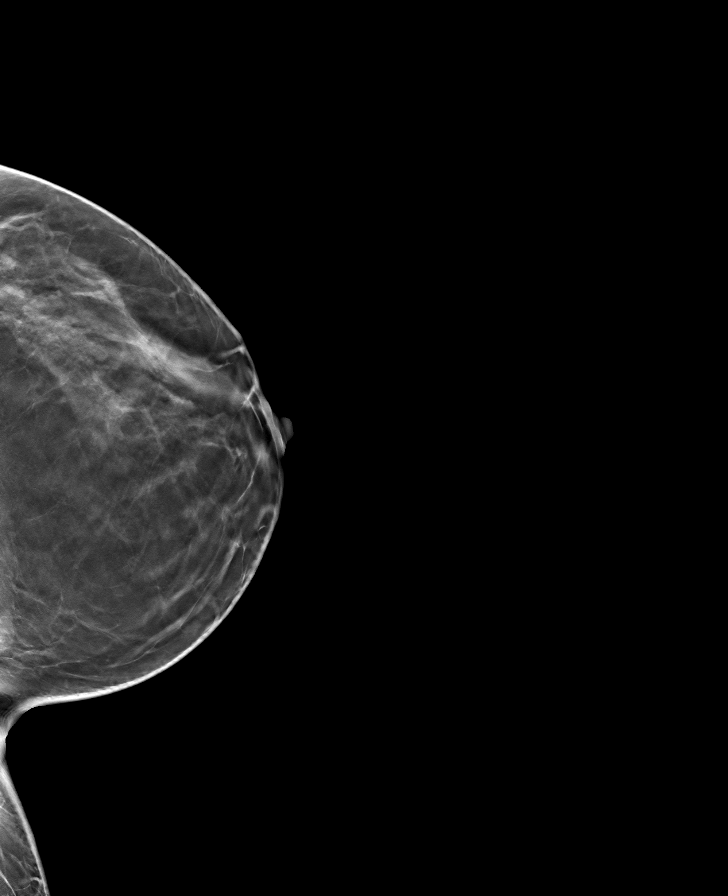

[8 of 24 positions shown; findings below may reference images not displayed]

ACR Breast Density Category b: There are scattered areas of
fibroglandular density.
FINDINGS: There are no findings suspicious for malignancy. Images were
processed with CAD.
IMPRESSION: No mammographic evidence of malignancy. A result letter of this
screening mammogram will be mailed directly to the patient.

RECOMMENDATION:
Screening mammogram in one year. (Code:YK-D-77N)

The American Cancer Society recommends annual MRI and mammography in
patients with an estimated lifetime risk of developing breast cancer
greater than 20 - 25%, or who are known or suspected to be positive
for the breast cancer gene.

BI-RADS CATEGORY  1: Negative.

## 2022-08-19 ENCOUNTER — Ambulatory Visit
Admission: EM | Admit: 2022-08-19 | Discharge: 2022-08-19 | Disposition: A | Discharge status: Home / Self Care | Payer: 59 | Attending: Family Medicine | Admitting: Family Medicine

## 2022-08-19 ENCOUNTER — Encounter: Payer: Self-pay | Admitting: Emergency Medicine

## 2022-08-19 DIAGNOSIS — R051 Acute cough: Secondary | ICD-10-CM

## 2022-08-19 DIAGNOSIS — J4521 Mild intermittent asthma with (acute) exacerbation: Secondary | ICD-10-CM | POA: Diagnosis not present

## 2022-08-19 MED ORDER — PREDNISONE 10 MG (21) PO TBPK: ORAL_TABLET | Freq: Every day | ORAL | 0 refills | Status: DC

## 2022-08-19 MED ORDER — AZITHROMYCIN 250 MG PO TABS: 250.0000 mg | ORAL_TABLET | Freq: Every day | ORAL | 0 refills | Status: DC

## 2022-08-19 NOTE — Discharge Instructions (Addendum)
Stop by the pharmacy to pick up your prescriptions.  Follow up with your primary care provider as needed.  

## 2022-08-19 NOTE — ED Triage Notes (Signed)
Pt c/o cough, nasal congestion, bilateral ear pain and wheezing x 3 weeks.

## 2022-08-19 NOTE — ED Provider Notes (Signed)
[facility]    CSN: [id number] Arrival date & time: 08/19/22  1410      History   Chief Complaint Chief Complaint  Patient presents with   Cough   Nasal Congestion   Wheezing    HPI Pamela Perry is a 42 y.o. female.   HPI   Pamela Perry presents for scratchy throat, cough and nasal congestion that started a few weeks ago.  The cough has been persistent and the other symptoms have resolved.  No fever, vomiting, diarrhea. 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        Past Medical History:  Diagnosis Date   Family history of breast cancer    History of Papanicolaou smear of cervix 11/29/2014   NIL/Neg   Increased risk of breast cancer    IBIS 24%   Testing of female for genetic disease carrier status 11/21/2013   MyRisk Neg    Patient Active Problem List   Diagnosis Date Noted   Family history of breast cancer 12/21/2017   Increased risk of breast cancer 12/14/2016   Vaccine counseling 04/22/2016    Past Surgical History:  Procedure Laterality Date   BUNIONECTOMY  2006   CESAREAN SECTION      OB History     Gravida  1   Para  1   Term  1   Preterm      AB      Living  1      SAB      IAB      Ectopic      Multiple      Live Births  1            Home Medications    Prior to Admission medications   Medication Sig Start Date End Date Taking? Authorizing Provider  azithromycin (ZITHROMAX Z-PAK) 250 MG tablet Take 1 tablet (250 mg total) by mouth daily. Take 2 tablets on day 1 08/19/22  Yes Shawnna Pancake, DO  predniSONE (STERAPRED UNI-PAK 21 TAB) 10 MG (21) TBPK tablet Take by mouth daily. Take 6 tabs by mouth daily for 1, then 5 tabs for 1 day, then 4 tabs for 1 day, then 3 tabs for 1 day, then 2 tabs for 1 day, then 1 tab for 1 day. 08/19/22  Yes Nyrie Sigal, DO  albuterol (PROVENTIL HFA;VENTOLIN HFA) 108 (90 Base) MCG/ACT inhaler Inhale 1-2 puffs into the lungs every 4 (four) hours as needed.  04/12/14   [provider]  Cholecalciferol (VITAMIN D3 PO) Take by mouth.    [provider]  clotrimazole-betamethasone (LOTRISONE) cream Apply externally BID prn sx up to 2 wks 02/10/21   Copland, Ilona Sorrel, PA-C  desogestrel-ethinyl estradiol (VELIVET) 0.1/0.125/0.15 -0.025 MG tablet Take 1 tablet by mouth daily. 02/11/22   Copland, Ilona Sorrel, PA-C  MAGNESIUM GLYCINATE PO Take by mouth.    [provider]  Multiple Vitamin (MULTI-VITAMIN) tablet Take 1 tablet by mouth daily.    [provider]  Multiple Vitamins-Minerals (ZINC PO) Take by mouth.    [provider]  TURMERIC PO Take by mouth.    [provider]    Family History Family History  Problem Relation Age of Onset   Breast cancer Maternal Aunt 30       dx twice 30's and 50's   Prostate cancer Maternal Grandfather 62   Breast cancer Other        also leukemia    Social History Social  History   Tobacco Use   Smoking status: Never   Smokeless tobacco: Never  Vaping Use   Vaping Use: Never used  Substance Use Topics   Alcohol use: Not Currently    Comment: occasional   Drug use: No     Allergies   Hydromorphone hcl, Morphine, Latex, Ondansetron hcl, Other, Padimate o, and Phenergan [promethazine hcl]   Review of Systems Review of Systems: negative unless otherwise stated in HPI.      Physical Exam Triage Vital Signs ED Triage Vitals  Enc Vitals Group     BP      Pulse      Resp      Temp      Temp src      SpO2      Weight      Height      Head Circumference      Peak Flow      Pain Score      Pain Loc      Pain Edu?      Excl. in GC?    No data found.  Updated Vital Signs BP 115/81 (BP Location: Right Arm)   Pulse 98   Temp 99.6 F (37.6 C) (Oral)   Resp 16   SpO2 95%   Visual Acuity Right Eye Distance:   Left Eye Distance:   Bilateral Distance:    Right Eye Near:   Left Eye Near:    Bilateral Near:     Physical Exam GEN:     alert, non-toxic  appearing female in no distress    HENT:  mucus membranes moist, no nasal discharge EYES:   pupils equal and reactive, no scleral injection or discharge NECK:  normal RO RESP:  no increased work of breathing, expiratory wheezing throughout, no rales, rhonchi CVS:   regular rate and rhythm Skin:   warm and dry, no rash on visible skin    UC Treatments / Results  Labs (all labs ordered are listed, but only abnormal results are displayed) Labs Reviewed - No data to display  EKG   Radiology No results found.  Procedures Procedures (including critical care time)  Medications Ordered in UC Medications - No data to display  Initial Impression / Assessment and Plan / UC Course  I have reviewed the triage vital signs and the nursing notes.  Pertinent labs & imaging results that were available during my care of the patient were reviewed by me and considered in my medical decision making (see chart for details).      Pt is a 42 y.o. female who presents for 3 weeks of cough that is not improving.  Sherene is afebrile here without recent antipyretics. Satting adequately on room air. Overall pt is ill but non-toxic appearing, well hydrated, without respiratory distress. Pulmonary exam is remarkable for diffuse expiratory wheezing and frequent cough.  After shared decision making, we will not pursue chest x-ray at this time as it currently would not change management.  COVID  and influenza testing deferred due to length of symptoms.   Treat acute asthmatic bronchitis with steroids and antibiotics as below.  Typical duration of symptoms discussed. Return and ED precautions given and patient voiced understanding.   Discussed MDM, treatment plan and plan for follow-up with patient who agrees with plan.      Final Clinical Impressions(s) / UC Diagnoses   Final diagnoses:  Mild intermittent asthma with exacerbation  Acute cough     Discharge Instructions  Stop by the pharmacy to  pick up your prescriptions.  Follow up with your primary care provider as needed.      ED Prescriptions     Medication Sig Dispense Auth. Provider   azithromycin (ZITHROMAX Z-PAK) 250 MG tablet Take 1 tablet (250 mg total) by mouth daily. Take 2 tablets on day 1 6 tablet Gautham Hewins, DO   predniSONE (STERAPRED UNI-PAK 21 TAB) 10 MG (21) TBPK tablet Take by mouth daily. Take 6 tabs by mouth daily for 1, then 5 tabs for 1 day, then 4 tabs for 1 day, then 3 tabs for 1 day, then 2 tabs for 1 day, then 1 tab for 1 day. 21 tablet Katha Cabal, DO      PDMP not reviewed this encounter.   Katha Cabal, DO 08/24/22 2030

## 2022-08-27 ENCOUNTER — Encounter: Payer: Self-pay | Admitting: Emergency Medicine

## 2022-08-27 ENCOUNTER — Emergency Department
Admission: EM | Admit: 2022-08-27 | Discharge: 2022-08-27 | Disposition: A | Discharge status: Home / Self Care | Payer: 59 | Attending: Emergency Medicine | Admitting: Emergency Medicine

## 2022-08-27 ENCOUNTER — Emergency Department: Payer: 59

## 2022-08-27 ENCOUNTER — Other Ambulatory Visit: Payer: Self-pay

## 2022-08-27 DIAGNOSIS — R531 Weakness: Secondary | ICD-10-CM | POA: Insufficient documentation

## 2022-08-27 DIAGNOSIS — R55 Syncope and collapse: Secondary | ICD-10-CM | POA: Diagnosis not present

## 2022-08-27 DIAGNOSIS — F410 Panic disorder [episodic paroxysmal anxiety] without agoraphobia: Secondary | ICD-10-CM

## 2022-08-27 DIAGNOSIS — R200 Anesthesia of skin: Secondary | ICD-10-CM | POA: Diagnosis not present

## 2022-08-27 DIAGNOSIS — R42 Dizziness and giddiness: Secondary | ICD-10-CM | POA: Insufficient documentation

## 2022-08-27 DIAGNOSIS — R202 Paresthesia of skin: Secondary | ICD-10-CM

## 2022-08-27 DIAGNOSIS — Z0389 Encounter for observation for other suspected diseases and conditions ruled out: Secondary | ICD-10-CM | POA: Diagnosis not present

## 2022-08-27 DIAGNOSIS — R9431 Abnormal electrocardiogram [ECG] [EKG]: Secondary | ICD-10-CM | POA: Diagnosis not present

## 2022-08-27 DIAGNOSIS — R29818 Other symptoms and signs involving the nervous system: Secondary | ICD-10-CM | POA: Diagnosis not present

## 2022-08-27 LAB — CBC
HCT: 44.7 % (ref 36.0–46.0)
Hemoglobin: 15.1 g/dL — ABNORMAL HIGH (ref 12.0–15.0)
MCH: 30 pg (ref 26.0–34.0)
MCHC: 33.8 g/dL (ref 30.0–36.0)
MCV: 88.7 fL (ref 80.0–100.0)
Platelets: 398 10*3/uL (ref 150–400)
RBC: 5.04 MIL/uL (ref 3.87–5.11)
RDW: 12.7 % (ref 11.5–15.5)
WBC: 8.2 10*3/uL (ref 4.0–10.5)
nRBC: 0 % (ref 0.0–0.2)

## 2022-08-27 LAB — COMPREHENSIVE METABOLIC PANEL
ALT: 32 U/L (ref 0–44)
AST: 22 U/L (ref 15–41)
Albumin: 4 g/dL (ref 3.5–5.0)
Alkaline Phosphatase: 46 U/L (ref 38–126)
Anion gap: 10 (ref 5–15)
BUN: 14 mg/dL (ref 6–20)
CO2: 27 mmol/L (ref 22–32)
Calcium: 9.1 mg/dL (ref 8.9–10.3)
Chloride: 100 mmol/L (ref 98–111)
Creatinine, Ser: 0.7 mg/dL (ref 0.44–1.00)
GFR, Estimated: >60 mL/min (ref >60)
Glucose, Bld: 98 mg/dL (ref 70–99)
Potassium: 3.6 mmol/L (ref 3.5–5.1)
Sodium: 137 mmol/L (ref 135–145)
Total Bilirubin: 0.5 mg/dL (ref 0.3–1.2)
Total Protein: 7.6 g/dL (ref 6.5–8.1)

## 2022-08-27 LAB — DIFFERENTIAL
Abs Immature Granulocytes: 0.05 10*3/uL (ref 0.00–0.07)
Basophils Absolute: 0 10*3/uL (ref 0.0–0.1)
Basophils Relative: 0 %
Eosinophils Absolute: 0.1 10*3/uL (ref 0.0–0.5)
Eosinophils Relative: 1 %
Immature Granulocytes: 1 %
Lymphocytes Relative: 36 %
Lymphs Abs: 3 10*3/uL (ref 0.7–4.0)
Monocytes Absolute: 0.4 10*3/uL (ref 0.1–1.0)
Monocytes Relative: 5 %
Neutro Abs: 4.7 10*3/uL (ref 1.7–7.7)
Neutrophils Relative %: 57 %

## 2022-08-27 LAB — PROTIME-INR
INR: 1 (ref 0.8–1.2)
Prothrombin Time: 12.8 seconds (ref 11.4–15.2)

## 2022-08-27 LAB — CBG MONITORING, ED: Glucose-Capillary: 84 mg/dL (ref 70–99)

## 2022-08-27 LAB — APTT: aPTT: 26 seconds (ref 24–36)

## 2022-08-27 LAB — ETHANOL: Alcohol, Ethyl (B): <10 mg/dL (ref <10)

## 2022-08-27 MED ORDER — MIDAZOLAM HCL 2 MG/2ML IJ SOLN
2.0000 mg | Freq: Once | INTRAMUSCULAR | Status: AC
  Administered 2022-08-27: 2 mg via INTRAVENOUS
  Filled 2022-08-27: qty 2

## 2022-08-27 MED ORDER — IOHEXOL 350 MG/ML SOLN
75.0000 mL | Freq: Once | INTRAVENOUS | Status: AC | PRN
  Administered 2022-08-27: 75 mL via INTRAVENOUS

## 2022-08-27 MED ORDER — METOCLOPRAMIDE HCL 5 MG/ML IJ SOLN: 10.0000 mg | Freq: Once | INTRAMUSCULAR | Status: DC

## 2022-08-27 MED ORDER — SODIUM CHLORIDE 0.9 % IV BOLUS
500.0000 mL | Freq: Once | INTRAVENOUS | Status: AC
  Administered 2022-08-27: 500 mL via INTRAVENOUS

## 2022-08-27 MED ORDER — SODIUM CHLORIDE 0.9% FLUSH: 3.0000 mL | Freq: Once | INTRAVENOUS | Status: DC

## 2022-08-27 MED ORDER — LORAZEPAM 1 MG PO TABS
1.0000 mg | ORAL_TABLET | Freq: Once | ORAL | Status: AC
  Administered 2022-08-27: 1 mg via ORAL
  Filled 2022-08-27: qty 1

## 2022-08-27 MED ORDER — KETOROLAC TROMETHAMINE 15 MG/ML IJ SOLN
15.0000 mg | Freq: Once | INTRAMUSCULAR | Status: AC
  Administered 2022-08-27: 15 mg via INTRAVENOUS
  Filled 2022-08-27: qty 1

## 2022-08-27 NOTE — ED Notes (Signed)
Patient to CT with RN a this time.

## 2022-08-27 NOTE — Discharge Instructions (Signed)
You were seen in the emergency department for right-sided numbness.  You had CT scans and an MRI that did not show any signs of a stroke.  Concerned that this was from anxiety and stress.  Follow-up closely with your primary care physician.  Stay hydrated and drink plenty of fluids.

## 2022-08-27 NOTE — Consult Note (Signed)
NEURO HOSPITALIST CONSULT NOTE   Requestig physician: Dr. Arnoldo Morale  Reason for Consult: Acute onset of presyncope with patchy right face, arm and leg numbness  History obtained from:   Patient and Chart     HPI:                                                                                                                                          Pamela Perry is an 42 y.o. female with a PMHx of anxiety attacks in her 47's who presents to the ED from the [facility] after having a near-syncopal episode while at lunch. She was tearful in Triage, stating that she felt that she was "going to pass out and something is wrong."  She was at lunch with her husband and at approximately 1145 she started feeling very hot, lightheaded and dizzy. She then walked outside and was having numbness and tingling sensation to her right arm. She stated that she recently finished prednisone. A Code Stroke was called in the ED.   She denies any headache. Has chronic back problems. On evaluation by neurology, she endorses patchy right face, arm and leg numbness with dysesthesia on touching the affected regions. She traces out the regions of numbness as follows: V2 distribution of her right face, a strip of sensory disturbance along the right lateral aspect of her upper arm and a patch of numbness along the posterior aspect of her right calf, with the remaining portions of her face and limbs feeling normal. She denies any weakness. Also with no vertigo, confusion, dysphasia, vision loss or ataxia.   Past Medical History:  Diagnosis Date   Family history of breast cancer    History of Papanicolaou smear of cervix 11/29/2014   NIL/Neg   Increased risk of breast cancer    IBIS 24%   Testing of female for genetic disease carrier status 11/21/2013   MyRisk Neg    Past Surgical History:  Procedure Laterality Date   BUNIONECTOMY  2006   CESAREAN SECTION      Family History  Problem  Relation Age of Onset   Breast cancer Maternal Aunt 30       dx twice 30's and 50's   Prostate cancer Maternal Grandfather 46   Breast cancer Other        also leukemia             Social History:  reports that she has never smoked. She has never used smokeless tobacco. She reports that she does not currently use alcohol. She reports that she does not use drugs.  Allergies  Allergen Reactions   Hydromorphone Hcl Shortness Of Breath and Other (See Comments)   Morphine Itching and Other (See Comments)   Latex Itching   Ondansetron  Hcl Hives   Other Other (See Comments)    Ingredient in chapsticks and lip glosses causes lip swelling   Padimate O Itching and Swelling   Phenergan [Promethazine Hcl] Hives    MEDICATIONS:                                                                                                                      No current facility-administered medications on file prior to encounter.   Current Outpatient Medications on File Prior to Encounter  Medication Sig Dispense Refill   albuterol (PROVENTIL HFA;VENTOLIN HFA) 108 (90 Base) MCG/ACT inhaler Inhale 1-2 puffs into the lungs every 4 (four) hours as needed.      azithromycin (ZITHROMAX Z-PAK) 250 MG tablet Take 1 tablet (250 mg total) by mouth daily. Take 2 tablets on day 1 6 tablet 0   Cholecalciferol (VITAMIN D3 PO) Take by mouth.     clotrimazole-betamethasone (LOTRISONE) cream Apply externally BID prn sx up to 2 wks 15 g 0   desogestrel-ethinyl estradiol (VELIVET) 0.1/0.125/0.15 -0.025 MG tablet Take 1 tablet by mouth daily. 84 tablet 3   MAGNESIUM GLYCINATE PO Take by mouth.     Multiple Vitamin (MULTI-VITAMIN) tablet Take 1 tablet by mouth daily.     Multiple Vitamins-Minerals (ZINC PO) Take by mouth.     predniSONE (STERAPRED UNI-PAK 21 TAB) 10 MG (21) TBPK tablet Take by mouth daily. Take 6 tabs by mouth daily for 1, then 5 tabs for 1 day, then 4 tabs for 1 day, then 3 tabs for 1 day, then 2 tabs for 1  day, then 1 tab for 1 day. 21 tablet 0   TURMERIC PO Take by mouth.        ROS:                                                                                                                                       As per HPI. Does not endorse any additional symptoms.    Blood pressure 129/77, pulse 98, temperature 97.9 F (36.6 C), temperature source Oral, resp. rate 18, SpO2 94 %.   General Examination:  Physical Exam HEENT- [place]/AT   Lungs- Respirations unlabored Extremities- Warm and well-perfused. No edema.   Neurological Examination Mental Status: Awake and alert. Tearful with anxious affect. Fully oriented. Thought content appropriate.  Speech fluent without evidence of aphasia.  No dysarthria. Able to follow all commands without difficulty. Cranial Nerves: II: Temporal visual fields intact with no extinction to DSS. PERRL. III,IV, VI: No ptosis. EOMI. No nystagmus. V: Temp sensation equal bilaterally except for subjectively decreased sensation and dysesthesia to FT in right V2 distribution. VII: Smile symmetric VIII: Hearing intact to voice IX,X: No hypophonia or hoarseness XI: Symmetric XII: Midline tongue extension Motor: RUE: 5/5 LUE: 5/5 RLE: 5/5 LLE: 5/5 Sensory: Temp and FT intact on the left.  RUE: hyperesthesia to lateral aspect of right forearm, otherwise normal.  RLE: Hyperesthesia to dorsum of right foot and lateral aspect of right leg below the knee. There is hypoesthesia to the medial aspect of the right leg below the knee; otherwise normal.  Deep Tendon Reflexes: 3+ and symmetric bilateral biceps, brachioradialis and patellae. Achilles reflexes are 2+ bilaterally.  Plantars: Right: downgoingLeft: downgoing Cerebellar: No ataxia with FNF bilaterally Gait: Deferred    Lab Results: Basic Metabolic Panel: Recent Labs  Lab 08/27/22 1314  NA 137  K 3.6   CL 100  CO2 27  GLUCOSE 98  BUN 14  CREATININE 0.70  CALCIUM 9.1    CBC: Recent Labs  Lab 08/27/22 1314  WBC 8.2  NEUTROABS 4.7  HGB 15.1*  HCT 44.7  MCV 88.7  PLT 398    Cardiac Enzymes: No results for input(s): "CKTOTAL", "CKMB", "CKMBINDEX", "TROPONINI" in the last 168 hours.  Lipid Panel: No results for input(s): "CHOL", "TRIG", "HDL", "CHOLHDL", "VLDL", "LDLCALC" in the last 168 hours.  Imaging: CT HEAD CODE STROKE WO CONTRAST  Addendum Date: 08/27/2022   ADDENDUM REPORT: 08/27/2022 13:42 ADDENDUM: Code stroke imaging results were communicated on 08/27/2022 at 1:42 pm to provider Dr. Arnoldo Morale Via telephone, who verbally acknowledged these results. Electronically Signed   By: Orvan Falconer M.D.   On: 08/27/2022 13:42   Result Date: 08/27/2022 CLINICAL DATA:  Code stroke. Neuro deficit, acute, stroke suspected. Right-sided numbness. EXAM: CT HEAD WITHOUT CONTRAST TECHNIQUE: Contiguous axial images were obtained from the base of the skull through the vertex without intravenous contrast. RADIATION DOSE REDUCTION: This exam was performed according to the departmental dose-optimization program which includes automated exposure control, adjustment of the mA and/or kV according to patient size and/or use of iterative reconstruction technique. COMPARISON:  None Available. FINDINGS: Brain: No acute intracranial hemorrhage. Gray-white differentiation is preserved. No hydrocephalus or extra-axial collection. No mass effect or midline shift. Vascular: No hyperdense vessel or unexpected calcification. Skull: No calvarial fracture or suspicious bone lesion. Skull base is unremarkable. Sinuses/Orbits: Unremarkable. Other: None. ASPECTS (Alberta Stroke Program Early CT Score) - Ganglionic level infarction (caudate, lentiform nuclei, internal capsule, insula, M1-M3 cortex): 7 - Supraganglionic infarction (M4-M6 cortex): 3 Total score (0-10 with 10 being normal): 10 IMPRESSION: No acute intracranial  hemorrhage or evidence of evolving large vessel territory infarction. ASPECT score is 10. Radiology assistant personnel have been notified to put me in telephone contact with the referring physician or the referring physician's clinical representative in order to discuss these findings. Once this communication is established I will issue an addendum to this report for documentation purposes. Electronically Signed: By: Orvan Falconer M.D. On: 08/27/2022 13:38     Assessment: 42 year old female presenting with acute onset of presyncope with  patchy right face, arm and leg numbness - Exam reveals an anxious and tearful patient with patchy dysesthesia and numbness to her right face, arm and leg. No other abnormalities on neurological exam. BP and other vitals are normal.  - CT head: No acute intracranial hemorrhage or evidence of evolving large vessel territory infarction. ASPECT score is 10.  - DDx:  - Overall presentation is most consistent with an acute anxiety attack. The patchy sensory findings are not localizable to a single lesion along the neuraxis and multifocal acute lesions would also not be expected to be associated with the limited distributions of the sensory disturbances noted.  - Low likelihood of a significant vascular lesion at her age given no stroke risk factors, but will obtain CTA of head to rule out critical vertebrobasilar stenosis or dissection which could present with presyncope.   Recommendations: - CTA of head and neck (ordered) - MRI brain without contrast (ordered) - Frequent neuro checks  Addendum: - CTA of head and neck: Normal - MRI brain: Normal.  - Can be discharged home with outpatient PCP follow up.    Electronically signed: Dr. Caryl Pina 08/27/2022, 2:17 PM

## 2022-08-27 NOTE — ED Provider Notes (Addendum)
[facility] Provider Note    Event Date/Time   First MD Initiated Contact with Patient 08/27/22 1330     (approximate)   History   Near Syncope   HPI  Pamela Perry is a 42 y.o. female past medical history significant for anxiety who presents to the emergency department with right-sided numbness.  States that she was at lunch with her husband and at approximately 1145 she started feeling very hot, lightheaded and dizzy.  States that she went outside and was having numbness and tingling sensation to her right arm.  Now complaining of tingling sensation to her arm, and face.  No headache.  Patient is tearful and crying, states that she has a history of anxiety attacks but has not had an anxiety attack in quite some time.  States that she does not feel that this is her anxiety.  No history of a migraine headache.  No recent falls or trauma.  Not on any blood thinners.  No daily medications.  No similar symptoms in the past.     Physical Exam   Triage Vital Signs: ED Triage Vitals  Enc Vitals Group     BP 08/27/22 1315 129/77     Pulse Rate 08/27/22 1315 98     Resp 08/27/22 1315 18     Temp 08/27/22 1315 97.9 F (36.6 C)     Temp Source 08/27/22 1315 Oral     SpO2 08/27/22 1315 94 %     Weight --      Height --      Head Circumference --      Peak Flow --      Pain Score 08/27/22 1309 0     Pain Loc --      Pain Edu? --      Excl. in GC? --     Most recent vital signs: Vitals:   08/27/22 1315 08/27/22 1656  BP: 129/77 118/82  Pulse: 98 98  Resp: 18 13  Temp: 97.9 F (36.6 C)   SpO2: 94% 98%    Physical Exam Constitutional:      Appearance: She is well-developed.  HENT:     Head: Atraumatic.  Eyes:     Conjunctiva/sclera: Conjunctivae normal.  Cardiovascular:     Rate and Rhythm: Regular rhythm.  Pulmonary:     Effort: No respiratory distress.  Abdominal:     General: There is no distension.  Musculoskeletal:        General:  Normal range of motion.     Cervical back: Normal range of motion.  Skin:    General: Skin is warm.  Neurological:     Mental Status: She is alert. Mental status is at baseline.     GCS: GCS eye subscore is 4. GCS verbal subscore is 5. GCS motor subscore is 6.     Cranial Nerves: Cranial nerves 2-12 are intact.     Sensory: Sensory deficit (Decree sensation but sensation intact to the right face, upper extremity and lower extremity to the right) present.     Motor: Weakness present.     Coordination: Coordination is intact.     Gait: Gait is intact.     Deep Tendon Reflexes: Reflexes are normal and symmetric.     Comments: Tearful and crying.  No dysmetria.  Normal finger-to-nose.  +2 radial and DP pulses that are equal bilaterally.     IMPRESSION / MDM / ASSESSMENT AND PLAN / ED COURSE  I  reviewed the triage vital signs and the nursing notes.  On arrival to the emergency department patient with right hemibody paresthesias.  Patient is within the window of TNK.  Last known well 1145.  Activated code stroke.  Immediately taken to CT scan.  Glucose within normal limits.  Given her low NIH scale of 1 do not feel that the patient would be a good TNK candidate.  CT scan of the head independently reviewed with no signs of intracranial hemorrhage or infarction.  Differential diagnosis including anxiety, complex migraine, CVA, intracranial hemorrhage  EKG  I, Corena Herter, the attending physician, personally viewed and interpreted this ECG.   Rate: Normal  Rhythm: Normal sinus  Axis: Normal  Intervals: Normal  ST&T Change: None  No tachycardic or bradycardic dysrhythmias while on cardiac telemetry.  RADIOLOGY I independently reviewed imaging, my interpretation of imaging: CT scan of the head with no signs of intracranial hemorrhage  LABS (all labs ordered are listed, but only abnormal results are displayed) Labs interpreted as -    Labs Reviewed  CBC - Abnormal; Notable for  the following components:      Result Value   Hemoglobin 15.1 (*)    All other components within normal limits  PROTIME-INR  APTT  DIFFERENTIAL  COMPREHENSIVE METABOLIC PANEL  ETHANOL  CBG MONITORING, ED  CBG MONITORING, ED  I-STAT CREATININE, ED     MDM  Given p.o. Ativan.    Patient was evaluated by neurology.  They ordered a CTA and MRI.  On reevaluation patient states that she had improvement of her numbness sensation, appears much more comfortable now.  Plan for MRI and if MRI is normal we will discharge home and likely had a cause of anxiety that caused her paresthesias.  If concern for stroke will admit for further stroke workup.  MRI negative for acute stroke.  Most likely anxiety.  Discussed follow-up with primary care provider.   PROCEDURES:  Critical Care performed: yes  .Critical Care  Performed by: Corena Herter, MD Authorized by: Corena Herter, MD   Critical care provider statement:    Critical care time (minutes):  30   Critical care time was exclusive of:  Separately billable procedures and treating other patients   Critical care was necessary to treat or prevent imminent or life-threatening deterioration of the following conditions:  CNS failure or compromise   Critical care was time spent personally by me on the following activities:  Development of treatment plan with patient or surrogate, discussions with consultants, evaluation of patient's response to treatment, examination of patient, ordering and review of laboratory studies, ordering and review of radiographic studies, ordering and performing treatments and interventions, pulse oximetry, re-evaluation of patient's condition and review of old charts   Patient's presentation is most consistent with acute presentation with potential threat to life or bodily function.   MEDICATIONS ORDERED IN ED: Medications  sodium chloride flush (NS) 0.9 % injection 3 mL (3 mLs Intravenous Not Given 08/27/22 1341)   LORazepam (ATIVAN) tablet 1 mg (1 mg Oral Given 08/27/22 1340)  sodium chloride 0.9 % bolus 500 mL (500 mLs Intravenous New Bag/Given 08/27/22 1427)  ketorolac (TORADOL) 15 MG/ML injection 15 mg (15 mg Intravenous Given 08/27/22 1430)  iohexol (OMNIPAQUE) 350 MG/ML injection 75 mL (75 mLs Intravenous Contrast Given 08/27/22 1452)  midazolam (VERSED) injection 2 mg (2 mg Intravenous Given 08/27/22 1653)    FINAL CLINICAL IMPRESSION(S) / ED DIAGNOSES   Final diagnoses:  Paresthesias  Rx / DC Orders   ED Discharge Orders     None        Note:  This document was prepared using Dragon voice recognition software and may include unintentional dictation errors.   Corena Herter, MD 08/27/22 1610    Corena Herter, MD 08/27/22 [id number]

## 2022-08-27 NOTE — Progress Notes (Signed)
Code Stroke activated @ 1326 with the pt already in CT.  RN note says to CT @ 1325.  Dr. Otelia Limes paged @ 1329.  Pt returned from CT @ 1336.  Dr. Otelia Limes in room @ 1341.  Josiah Lobo BSN, Occupational hygienist

## 2022-08-27 NOTE — ED Notes (Signed)
Code Stroke Called at this time.

## 2022-08-27 NOTE — Progress Notes (Signed)
Chap visited Pt in response to code stroke. Pt at the CT scan at this point. Family in the room. Mirna Mires took time to actively listen to Pt husband on life review as well as offering a compassionate presence.   08/27/22 1310  Spiritual Encounters  Type of Visit Initial  Care provided to: Family  Conversation partners present during encounter Nurse  Referral source Code page  Reason for visit Code  OnCall Visit Yes  Interventions  Spiritual Care Interventions Made Established relationship of care and support  Intervention Outcomes  Outcomes Reduced isolation  Spiritual Care Plan  Spiritual Care Issues Still Outstanding Chaplain will continue to follow  Advance Directives (For Healthcare)  Does Patient Have a Medical Advance Directive? No  Mental Health Advance Directives  Does Patient Have a Mental Health Advance Directive? No

## 2022-08-27 NOTE — ED Notes (Signed)
Activated code stroke w/[facility] rep [place]

## 2022-08-27 NOTE — ED Triage Notes (Signed)
Patient to ED from [facility] for near syncopal episode while at lunch. Patient states she feels like she is "going to pass out and something is wrong." Crying in triage. Stating right shoulder feels numb. Hx of anxiety attacks but hasn't had one since mid 20's. States she recently finished prednisone.  Mumma, MD assessing patient in triage. Equal grip and strength. Can feel on right side but states if feels different then left. Ambulatory- but feels dizzy. No Code Stroke at this time.

## 2022-09-27 DIAGNOSIS — M7061 Trochanteric bursitis, right hip: Secondary | ICD-10-CM | POA: Diagnosis not present

## 2022-11-22 DIAGNOSIS — Z0000 Encounter for general adult medical examination without abnormal findings: Secondary | ICD-10-CM | POA: Diagnosis not present

## 2022-11-22 DIAGNOSIS — Z13220 Encounter for screening for lipoid disorders: Secondary | ICD-10-CM | POA: Diagnosis not present

## 2022-11-24 DIAGNOSIS — Z0000 Encounter for general adult medical examination without abnormal findings: Secondary | ICD-10-CM | POA: Diagnosis not present

## 2022-11-24 DIAGNOSIS — R5383 Other fatigue: Secondary | ICD-10-CM | POA: Diagnosis not present

## 2022-11-24 DIAGNOSIS — R030 Elevated blood-pressure reading, without diagnosis of hypertension: Secondary | ICD-10-CM | POA: Diagnosis not present

## 2023-02-08 ENCOUNTER — Other Ambulatory Visit: Payer: Self-pay | Admitting: Obstetrics and Gynecology

## 2023-02-08 DIAGNOSIS — Z1231 Encounter for screening mammogram for malignant neoplasm of breast: Secondary | ICD-10-CM

## 2023-02-17 ENCOUNTER — Other Ambulatory Visit: Payer: Self-pay | Admitting: Obstetrics and Gynecology

## 2023-02-17 DIAGNOSIS — Z3041 Encounter for surveillance of contraceptive pills: Secondary | ICD-10-CM

## 2023-03-02 ENCOUNTER — Ambulatory Visit
Admission: RE | Admit: 2023-03-02 | Discharge: 2023-03-02 | Disposition: A | Discharge status: Home / Self Care | Payer: 59 | Source: Ambulatory Visit | Attending: Obstetrics and Gynecology | Admitting: Obstetrics and Gynecology

## 2023-03-02 DIAGNOSIS — Z1231 Encounter for screening mammogram for malignant neoplasm of breast: Secondary | ICD-10-CM | POA: Insufficient documentation

## 2023-04-15 NOTE — Progress Notes (Unsigned)
No chief complaint on file.    HPI:      [name] is a 42 y.o. G1P1001 who LMP was No LMP recorded. (Menstrual status: Oral contraceptives)., presents today for her annual examination.  Her menses are regular every 28-30 days, lasting 3-5 days on OCPs.  Dysmenorrhea mild, more LBP. She does not have intermenstrual bleeding. Having vasomotor sx. Has also noticed increased body odor under arms/genital area, even with washing. Using Dove sens skin soap, under increased stress.   Sex activity: single partner, contraception - OCP (estrogen/progesterone). No pain/bleeding.  Last Pap: 02/11/22 Results were: no abnormalities /neg HPV DNA  Hx of STDs: none  Last mammogram: 03/02/23 Results were: normal--routine follow-up in 12 months There is a FH of breast cancer in her mat aunt and mat grt aunt.  Pt is MyRIsk neg 7/15 and her mom is BRCA neg. IBIS=24%. No screening breast MRI done. There is no FH of ovarian cancer. The patient does do self-breast exams. Taking Vit D supp.  Tobacco use: The patient denies current or previous tobacco use. Alcohol use: none  No drug use. Exercise: min active  She does get adequate calcium and Vitamin D in her diet. Labs with PCP 7/24  Has had lesion on RT side of neck for a couple yrs. Unsure if cyst vs lymph node per providers' opinion in past. NT, no change in size. PCP thinks lymph node. Pt declines u/s.   Pt with perirectal itching 10/22, treated with lotrisone crm with sx relief.   Past Medical History:  Diagnosis Date   Family history of breast cancer    History of Papanicolaou smear of cervix 11/29/2014   NIL/Neg   Increased risk of breast cancer    IBIS 24%   Testing of female for genetic disease carrier status 11/21/2013   MyRisk Neg    Past Surgical History:  Procedure Laterality Date   BUNIONECTOMY  2006   CESAREAN SECTION      Family History  Problem Relation Age of Onset   Breast cancer Maternal Aunt 30       dx twice  30's and 50's   Prostate cancer Maternal Grandfather 73   Breast cancer Other        also leukemia    Social History   Socioeconomic History   Marital status: Married    Spouse name: Not on file   Number of children: Not on file   Years of education: Not on file   Highest education level: Not on file  Occupational History   Not on file  Tobacco Use   Smoking status: Never   Smokeless tobacco: Never  Vaping Use   Vaping status: Never Used  Substance and Sexual Activity   Alcohol use: Not Currently    Comment: occasional   Drug use: No   Sexual activity: Yes    Birth control/protection: Pill  Other Topics Concern   Not on file  Social History Narrative   Not on file   Social Drivers of Health   Financial Resource Strain: Low Risk  (11/24/2022)   Received from [facility]   Overall Financial Resource Strain (CARDIA)    Difficulty of Paying Living Expenses: Not hard at all  Food Insecurity: No Food Insecurity (11/24/2022)   Received from [facility]   Hunger Vital Sign    Worried About Running Out of Food in the Last Year: Never true    Ran Out of Food  in the Last Year: Never true  Transportation Needs: No Transportation Needs (11/24/2022)   Received from [facility] - Transportation    In the past 12 months, has lack of transportation kept you from medical appointments or from getting medications?: No    Lack of Transportation (Non-Medical): No  Physical Activity: Unknown (12/21/2017)   Exercise Vital Sign    Days of Exercise per Week: 0 days    Minutes of Exercise per Session: Not on file  Stress: Not on file  Social Connections: Not on file  Intimate Partner Violence: Not on file     Current Outpatient Medications:    albuterol (PROVENTIL HFA;VENTOLIN HFA) 108 (90 Base) MCG/ACT inhaler, Inhale 1-2 puffs into the lungs every 4 (four) hours as needed. , Disp: , Rfl:    azithromycin (ZITHROMAX Z-PAK)  250 MG tablet, Take 1 tablet (250 mg total) by mouth daily. Take 2 tablets on day 1, Disp: 6 tablet, Rfl: 0   Cholecalciferol (VITAMIN D3 PO), Take by mouth., Disp: , Rfl:    clotrimazole-betamethasone (LOTRISONE) cream, Apply externally BID prn sx up to 2 wks, Disp: 15 g, Rfl: 0   desogestrel-ethinyl estradiol (VELIVET) 0.1/0.125/0.15 -0.025 MG tablet, TAKE 1 TABLET BY MOUTH EVERY DAY, Disp: 84 tablet, Rfl: 0   MAGNESIUM GLYCINATE PO, Take by mouth., Disp: , Rfl:    Multiple Vitamin (MULTI-VITAMIN) tablet, Take 1 tablet by mouth daily., Disp: , Rfl:    Multiple Vitamins-Minerals (ZINC PO), Take by mouth., Disp: , Rfl:    predniSONE (STERAPRED UNI-PAK 21 TAB) 10 MG (21) TBPK tablet, Take by mouth daily. Take 6 tabs by mouth daily for 1, then 5 tabs for 1 day, then 4 tabs for 1 day, then 3 tabs for 1 day, then 2 tabs for 1 day, then 1 tab for 1 day., Disp: 21 tablet, Rfl: 0   TURMERIC PO, Take by mouth., Disp: , Rfl:   ROS:  Review of Systems  Constitutional:  Negative for fatigue, fever and unexpected weight change.  Respiratory:  Negative for cough, shortness of breath and wheezing.   Cardiovascular:  Negative for chest pain, palpitations and leg swelling.  Gastrointestinal:  Negative for blood in stool, constipation, diarrhea, nausea and vomiting.  Endocrine: Negative for cold intolerance, heat intolerance and polyuria.  Genitourinary:  Negative for dyspareunia, dysuria, flank pain, frequency, genital sores, hematuria, menstrual problem, pelvic pain, urgency, vaginal bleeding, vaginal discharge and vaginal pain.  Musculoskeletal:  Negative for back pain, joint swelling and myalgias.  Skin:  Negative for rash.  Neurological:  Negative for dizziness, syncope, light-headedness, numbness and headaches.  Hematological:  Negative for adenopathy.  Psychiatric/Behavioral:  Negative for agitation, confusion, sleep disturbance and suicidal ideas. The patient is not nervous/anxious.      Objective: There were no vitals taken for this visit.   Physical Exam Constitutional:      Appearance: She is well-developed.  Genitourinary:     Vulva normal.     Right Labia: No rash, tenderness or lesions.    Left Labia: No tenderness, lesions or rash.    No vaginal discharge, erythema or tenderness.      Right Adnexa: not tender and no mass present.    Left Adnexa: not tender and no mass present.    No cervical friability or polyp.     Uterus is not enlarged or tender.  Breasts:    Right: No mass, nipple discharge, skin change or tenderness.     Left:  No mass, nipple discharge, skin change or tenderness.  Neck:     Thyroid: No thyromegaly.   Cardiovascular:     Rate and Rhythm: Normal rate and regular rhythm.     Heart sounds: Normal heart sounds. No murmur heard. Pulmonary:     Effort: Pulmonary effort is normal.     Breath sounds: Normal breath sounds.  Abdominal:     Palpations: Abdomen is soft.     Tenderness: There is no abdominal tenderness. There is no guarding or rebound.  Musculoskeletal:        General: Normal range of motion.     Cervical back: Normal range of motion.  Lymphadenopathy:     Cervical: No cervical adenopathy.  Neurological:     General: No focal deficit present.     Mental Status: She is alert and oriented to person, place, and time.     Cranial Nerves: No cranial nerve deficit.  Skin:    General: Skin is warm and dry.  Psychiatric:        Mood and Affect: Mood normal.        Behavior: Behavior normal.        Thought Content: Thought content normal.        Judgment: Judgment normal.  Vitals reviewed.     Assessment/Plan:  Encounter for annual routine gynecological examination  Cervical cancer screening - Plan: Cytology - PAP  Screening for HPV (human papillomavirus) - Plan: Cytology - PAP  Encounter for surveillance of contraceptive pills - Plan: desogestrel-ethinyl estradiol (VELIVET) 0.1/0.125/0.15 -0.025 MG tablet; OCR  RF  Encounter for screening mammogram for malignant neoplasm of breast; pt has mammo appt.   Family history of breast cancer - Plan: MM 3D SCREEN BREAST BILATERAL  Increased risk of breast cancer - Plan: MM 3D SCREEN BREAST BILATERAL; pt aware of monthly SBE, yearly CBE and mammos as well as scr breast MRI. Will call for ref if desires. Cont Vit D supp  Lesion of neck--benign lesion; question cyst vs superficial lymph node. Reassurance. F/u if changes in size. Declines u/s.   No orders of the defined types were placed in this encounter.    GYN counsel breast self exam, mammography screening, adequate intake of calcium and vitamin D, diet and exercise     F/U  No follow-ups on file.  Lukka Black B. Randall Colden, PA-C 04/15/2023 4:33 PM

## 2023-04-18 ENCOUNTER — Ambulatory Visit (INDEPENDENT_AMBULATORY_CARE_PROVIDER_SITE_OTHER): Payer: 59 | Admitting: Obstetrics and Gynecology

## 2023-04-18 ENCOUNTER — Encounter: Payer: Self-pay | Admitting: Obstetrics and Gynecology

## 2023-04-18 VITALS — BP 101/70 | HR 78 | Ht 67.0 in | Wt 180.0 lb

## 2023-04-18 DIAGNOSIS — Z803 Family history of malignant neoplasm of breast: Secondary | ICD-10-CM

## 2023-04-18 DIAGNOSIS — Z9189 Other specified personal risk factors, not elsewhere classified: Secondary | ICD-10-CM

## 2023-04-18 DIAGNOSIS — Z3041 Encounter for surveillance of contraceptive pills: Secondary | ICD-10-CM

## 2023-04-18 DIAGNOSIS — Z01419 Encounter for gynecological examination (general) (routine) without abnormal findings: Secondary | ICD-10-CM | POA: Diagnosis not present

## 2023-04-18 DIAGNOSIS — Z1231 Encounter for screening mammogram for malignant neoplasm of breast: Secondary | ICD-10-CM

## 2023-04-18 MED ORDER — VELIVET 0.1/0.125/0.15 -0.025 MG PO TABS: 1.0000 | ORAL_TABLET | Freq: Every day | ORAL | 3 refills | Status: DC

## 2023-04-18 NOTE — Patient Instructions (Signed)
I value your feedback and you entrusting us with your care. If you get a [facility] patient survey, I would appreciate you taking the time to let us know about your experience today. Thank you! ? ? ?

## 2024-01-30 ENCOUNTER — Other Ambulatory Visit: Payer: Self-pay | Admitting: Obstetrics and Gynecology

## 2024-01-30 DIAGNOSIS — Z1231 Encounter for screening mammogram for malignant neoplasm of breast: Secondary | ICD-10-CM

## 2024-03-06 ENCOUNTER — Ambulatory Visit
Admission: RE | Admit: 2024-03-06 | Discharge: 2024-03-06 | Disposition: A | Discharge status: Home / Self Care | Source: Ambulatory Visit | Attending: Obstetrics and Gynecology | Admitting: Obstetrics and Gynecology

## 2024-03-06 DIAGNOSIS — Z1231 Encounter for screening mammogram for malignant neoplasm of breast: Secondary | ICD-10-CM | POA: Diagnosis present

## 2024-03-09 ENCOUNTER — Ambulatory Visit: Payer: Self-pay | Admitting: Obstetrics and Gynecology

## 2024-05-08 ENCOUNTER — Other Ambulatory Visit: Payer: Self-pay | Admitting: Obstetrics and Gynecology

## 2024-05-08 DIAGNOSIS — Z3041 Encounter for surveillance of contraceptive pills: Secondary | ICD-10-CM

## 2024-05-09 NOTE — Progress Notes (Signed)
 "  Chief Complaint  Patient presents with   Gynecologic Exam    No concerns     HPI:      [name] is a 44 y.o. G1P1001 who LMP was Patient's last menstrual period was 05/10/2024 (exact date)., presents today for her annual examination.  Her menses are regular every 28-30 days, lasting 3-5 days on OCPs, light to mod flow.  Dysmenorrhea mild, more LBP. She does not have intermenstrual bleeding.   Sex activity: single partner, contraception - OCP (estrogen/progesterone). No pain/bleeding/dryness. Last Pap: 02/11/22 Results were: no abnormalities /neg HPV DNA  Hx of STDs: none  Last mammogram: 03/06/24 Results were: normal--routine follow-up in 12 months There is a FH of breast cancer in her mat aunt and mat grt aunt.  Pt is MyRIsk neg 7/15 and her mom is BRCA neg. IBIS=24%. No screening breast MRI done. There is no FH of ovarian cancer. The patient occas does self-breast exams. Taking Vit D supp.  Tobacco use: The patient denies current or previous tobacco use. Alcohol use: none  No drug use. Exercise: min active  She does get adequate calcium and Vitamin D in her diet. Labs with PCP 10/25  Past Medical History:  Diagnosis Date   Family history of breast cancer    History of Papanicolaou smear of cervix 11/29/2014   NIL/Neg   Increased risk of breast cancer    IBIS 24%   Testing of female for genetic disease carrier status 11/21/2013   MyRisk Neg    Past Surgical History:  Procedure Laterality Date   BUNIONECTOMY  2006   CESAREAN SECTION      Family History  Problem Relation Age of Onset   Breast cancer Maternal Aunt 30       dx twice 30's and 50's   Prostate cancer Maternal Grandfather 70   Breast cancer Other        also leukemia    Social History   Socioeconomic History   Marital status: Married    Spouse name: Not on file   Number of children: Not on file   Years of education: Not on file   Highest education level: Not on file  Occupational  History   Not on file  Tobacco Use   Smoking status: Never   Smokeless tobacco: Never  Vaping Use   Vaping status: Never Used  Substance and Sexual Activity   Alcohol use: Not Currently   Drug use: No   Sexual activity: Yes    Birth control/protection: Pill  Other Topics Concern   Not on file  Social History Narrative   Not on file   Social Drivers of Health   Tobacco Use: Low Risk (05/10/2024)   Patient History    Smoking Tobacco Use: Never    Smokeless Tobacco Use: Never    Passive Exposure: Not on file  Financial Resource Strain: Low Risk  (02/22/2024)   Received from [facility]   Overall Financial Resource Strain (CARDIA)    Difficulty of Paying Living Expenses: Not very hard  Food Insecurity: No Food Insecurity (02/22/2024)   Received from [facility]   Epic    Within the past 12 months, you worried that your food would run out before you got the money to buy more.: Never true    Within the past 12 months, the food you bought just didn't last and you didn't have money to get more.: Never true  Transportation Needs: No Transportation  Needs (02/22/2024)   Received from [facility] - Transportation    In the past 12 months, has lack of transportation kept you from medical appointments or from getting medications?: No    Lack of Transportation (Non-Medical): No  Physical Activity: Not on file  Stress: Not on file  Social Connections: Not on file  Intimate Partner Violence: Not on file  Depression (EYV7-0): Not on file  Alcohol Screen: Not on file  Housing: Low Risk  (02/22/2024)   Received from [facility]   Epic    In the last 12 months, was there a time when you were not able to pay the mortgage or rent on time?: No    In the past 12 months, how many times have you moved where you were living?: 0    At any time in the past 12 months, were you homeless or living in a shelter (including  now)?: No  Utilities: Not At Risk (02/22/2024)   Received from [facility]   Epic    In the past 12 months has the electric, gas, oil, or water company threatened to shut off services in your home?: No  Health Literacy: Not on file     Current Outpatient Medications:    albuterol (PROVENTIL HFA;VENTOLIN HFA) 108 (90 Base) MCG/ACT inhaler, Inhale 1-2 puffs into the lungs every 4 (four) hours as needed. , Disp: , Rfl:    Cholecalciferol (VITAMIN D3 PO), Take by mouth., Disp: , Rfl:    Multiple Vitamin (MULTI-VITAMIN) tablet, Take 1 tablet by mouth daily., Disp: , Rfl:    Multiple Vitamins-Minerals (ZINC PO), Take by mouth., Disp: , Rfl:    Probiotic Product (PROBIOTIC PO), Take by mouth., Disp: , Rfl:    TURMERIC PO, Take by mouth., Disp: , Rfl:    desogestrel-ethinyl estradiol (VELIVET ) 0.1/0.125/0.15 -0.025 MG tablet, Take 1 tablet by mouth daily., Disp: 84 tablet, Rfl: 3  ROS:  Review of Systems  Constitutional:  Negative for fatigue, fever and unexpected weight change.  Respiratory:  Negative for cough, shortness of breath and wheezing.   Cardiovascular:  Negative for chest pain, palpitations and leg swelling.  Gastrointestinal:  Negative for blood in stool, constipation, diarrhea, nausea and vomiting.  Endocrine: Negative for cold intolerance, heat intolerance and polyuria.  Genitourinary:  Negative for dyspareunia, dysuria, flank pain, frequency, genital sores, hematuria, menstrual problem, pelvic pain, urgency, vaginal bleeding, vaginal discharge and vaginal pain.  Musculoskeletal:  Negative for back pain, joint swelling and myalgias.  Skin:  Negative for rash.  Neurological:  Negative for dizziness, syncope, light-headedness, numbness and headaches.  Hematological:  Negative for adenopathy.  Psychiatric/Behavioral:  Negative for agitation, confusion, sleep disturbance and suicidal ideas. The patient is not nervous/anxious.     Objective: BP 109/76   Pulse  92   Ht 5' 6 (1.676 m)   Wt 179 lb (81.2 kg)   LMP 05/10/2024 (Exact Date)   BMI 28.89 kg/m    Physical Exam Constitutional:      Appearance: She is well-developed.  Genitourinary:     Vulva normal.     Right Labia: No rash, tenderness or lesions.    Left Labia: No tenderness, lesions or rash.    No vaginal discharge, erythema or tenderness.      Right Adnexa: not tender and no mass present.    Left Adnexa: not tender and no mass present.    No cervical friability or polyp.  Uterus is not enlarged or tender.  Breasts:    Right: No mass, nipple discharge, skin change or tenderness.     Left: No mass, nipple discharge, skin change or tenderness.  Neck:     Thyroid : No thyromegaly.  Cardiovascular:     Rate and Rhythm: Normal rate and regular rhythm.     Heart sounds: Normal heart sounds. No murmur heard. Pulmonary:     Effort: Pulmonary effort is normal.     Breath sounds: Normal breath sounds.  Abdominal:     Palpations: Abdomen is soft.     Tenderness: There is no abdominal tenderness. There is no guarding or rebound.  Musculoskeletal:        General: Normal range of motion.     Cervical back: Normal range of motion.  Lymphadenopathy:     Cervical: No cervical adenopathy.  Neurological:     General: No focal deficit present.     Mental Status: She is alert and oriented to person, place, and time.     Cranial Nerves: No cranial nerve deficit.  Skin:    General: Skin is warm and dry.  Psychiatric:        Mood and Affect: Mood normal.        Behavior: Behavior normal.        Thought Content: Thought content normal.        Judgment: Judgment normal.  Vitals reviewed.     Assessment/Plan:  Encounter for annual routine gynecological examination  Encounter for surveillance of contraceptive pills - Plan: desogestrel-ethinyl estradiol (VELIVET ) 0.1/0.125/0.15 -0.025 MG tablet; OCP RF  Encounter for screening mammogram for malignant neoplasm of breast - Plan: MM  3D SCREENING MAMMOGRAM BILATERAL BREAST; pt current on mammo till 11/26  Family history of breast cancer--pt is MyRisk neg  Increased risk of breast cancer--aware of recommendations of monthly SBE, yearly CBE and mammos, as well as scr breast MRI. Will call for MRI ref prn.    Meds ordered this encounter  Medications   desogestrel-ethinyl estradiol (VELIVET ) 0.1/0.125/0.15 -0.025 MG tablet    Sig: Take 1 tablet by mouth daily.    Dispense:  84 tablet    Refill:  3    Supervising Provider:   ROBY, MICIA [[id number]]    GYN counsel breast self exam, mammography screening, adequate intake of calcium and vitamin D, diet and exercise     F/U  Return in about 1 year (around 05/10/2025).  Pamela Mcclaskey B. Sharod Petsch, PA-C 05/10/2024 4:28 PM "

## 2024-05-10 ENCOUNTER — Encounter: Payer: Self-pay | Admitting: Obstetrics and Gynecology

## 2024-05-10 ENCOUNTER — Ambulatory Visit (INDEPENDENT_AMBULATORY_CARE_PROVIDER_SITE_OTHER): Payer: Self-pay | Admitting: Obstetrics and Gynecology

## 2024-05-10 VITALS — BP 109/76 | HR 92 | Ht 66.0 in | Wt 179.0 lb

## 2024-05-10 DIAGNOSIS — Z803 Family history of malignant neoplasm of breast: Secondary | ICD-10-CM | POA: Diagnosis not present

## 2024-05-10 DIAGNOSIS — Z01419 Encounter for gynecological examination (general) (routine) without abnormal findings: Secondary | ICD-10-CM | POA: Diagnosis not present

## 2024-05-10 DIAGNOSIS — Z3041 Encounter for surveillance of contraceptive pills: Secondary | ICD-10-CM

## 2024-05-10 DIAGNOSIS — Z1231 Encounter for screening mammogram for malignant neoplasm of breast: Secondary | ICD-10-CM

## 2024-05-10 DIAGNOSIS — Z9189 Other specified personal risk factors, not elsewhere classified: Secondary | ICD-10-CM

## 2024-05-10 MED ORDER — VELIVET 0.1/0.125/0.15 -0.025 MG PO TABS: 1.0000 | ORAL_TABLET | Freq: Every day | ORAL | 3 refills | Status: AC

## 2024-05-10 NOTE — Patient Instructions (Signed)
 I value your feedback and you entrusting [place] with your care. If you get a [facility] patient survey, I would appreciate you taking the time to let us know about your experience today. Thank you! ? ? ?
